# Patient Record
Sex: Female | Born: 1965 | Hispanic: Yes | Marital: Married
Health system: Northeastern US, Academic
[De-identification: ages and names within clinical notes are randomized; demographics above are authoritative.]

---

## 2018-12-31 IMAGING — MR MRI RIGHT FOOT WITHOUT CONTRAST
5 of 7 series · 23 of 40 positions shown · IV contrast (gadolinium)
Comparison: None
COMPARISON: None.

MRI RIGHT FOOT WITHOUT CONTRAST, 12/31/2018 [DATE]: 
CLINICAL INDICATION: Plantar fasciitis. Right heel pain for 5 months.
TECHNIQUE: Multiplanar, multiecho position MR images of the right hind- and 
midfoot were performed without intravenous gadolinium
TECHNIQUE: Multiplanar, multisequence imaging of the hind-/midfoot was obtained 
without contrast.

[Series 101: survey_fullfov_transversal · axial · 10.0mm · 1.84mm/px · z∈[-82,+284]mm · 6 of 36 slices shown]
[im 1/36]
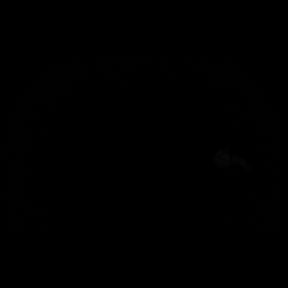
[im 8/36]
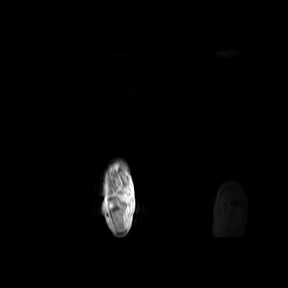
[im 15/36]
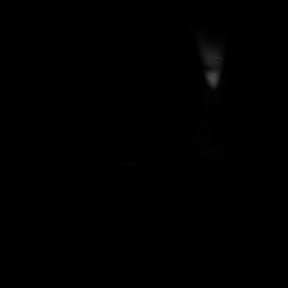
[im 22/36]
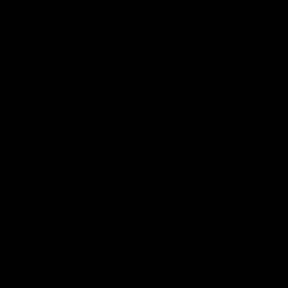
[im 29/36]
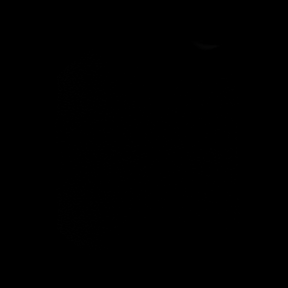
[im 36/36]
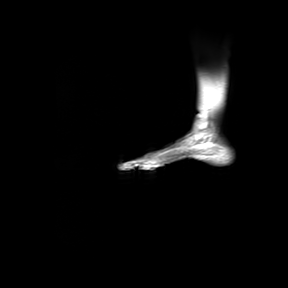

[Series 201: survey_right · axial · 10.0mm · 0.94mm/px · z∈[-40,+191]mm · 2 of 15 slices shown]
[im 1/15]
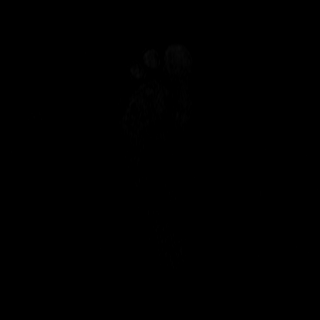
[im 15/15]
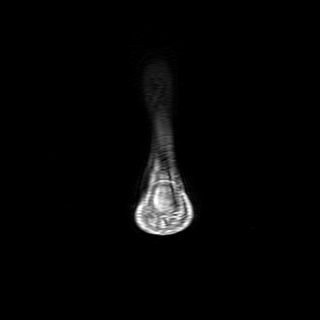

[Series 301: t1w_(id) · sagittal · 2.0mm · 0.20mm/px · 2 of 32 slices shown]
[im 1/32]
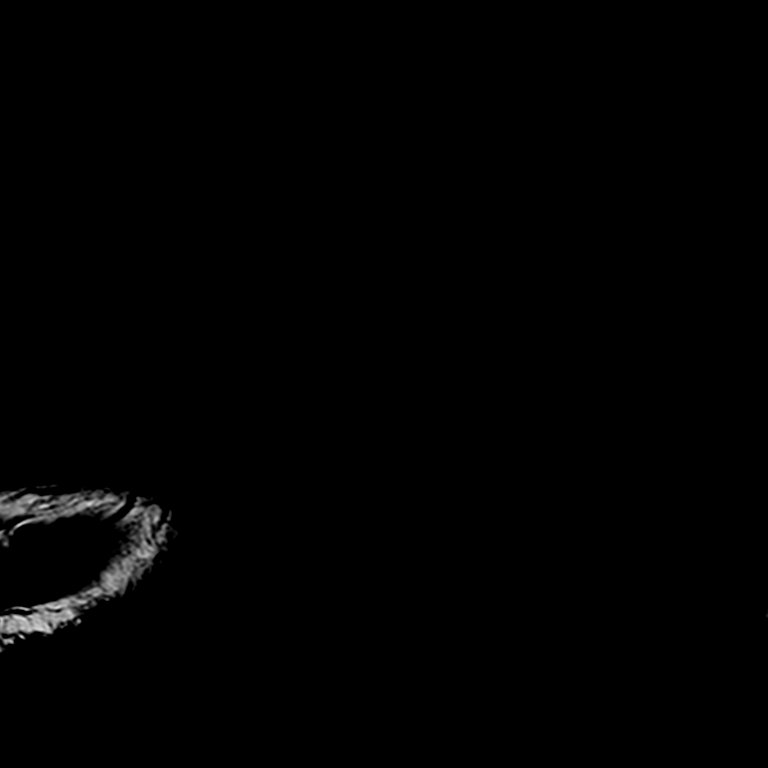
[im 7/32]
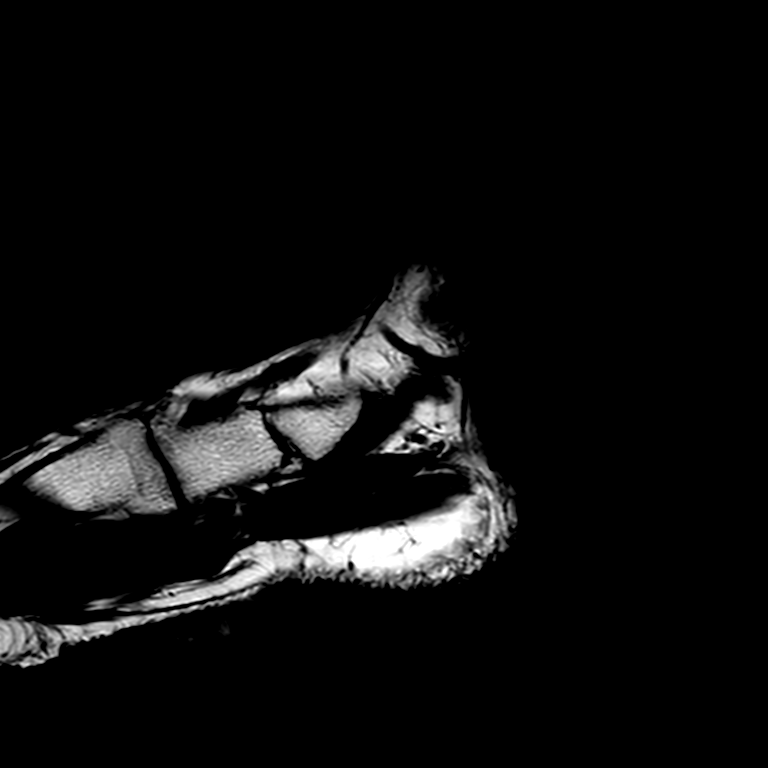

[Series 401: PD fat-sat · sagittal · 2.0mm · 0.30mm/px · 6 of 32 slices shown]
[im 1/32]
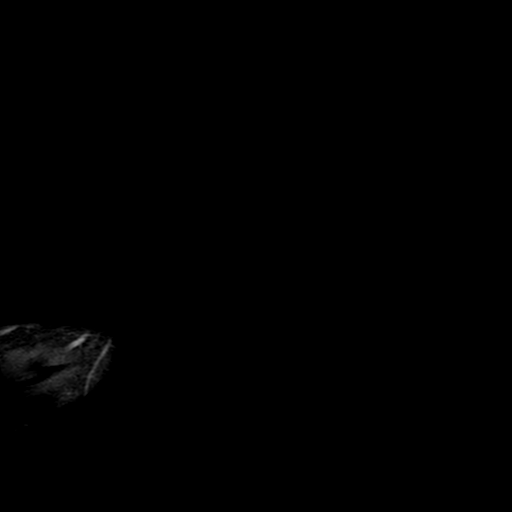
[im 7/32]
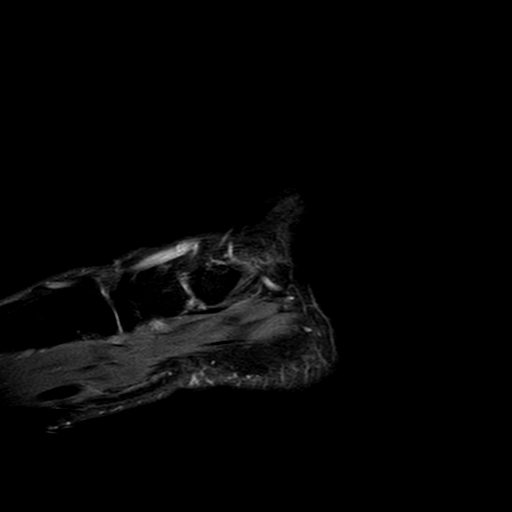
[im 13/32]
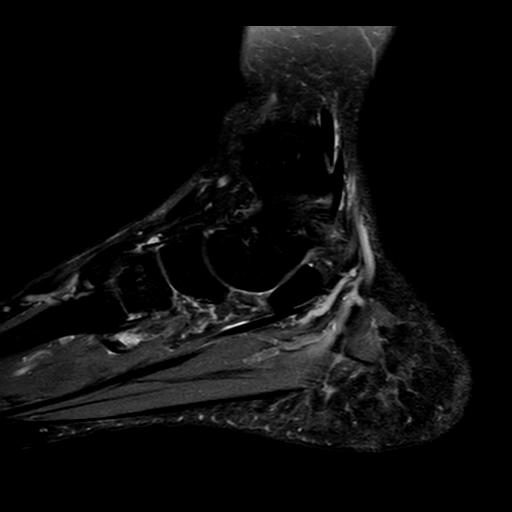
[im 19/32]
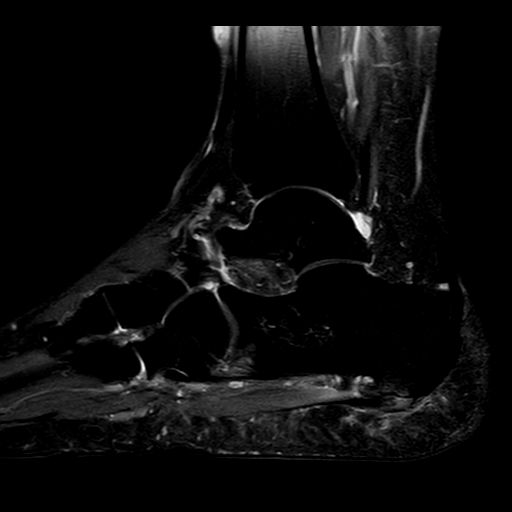
[im 25/32]
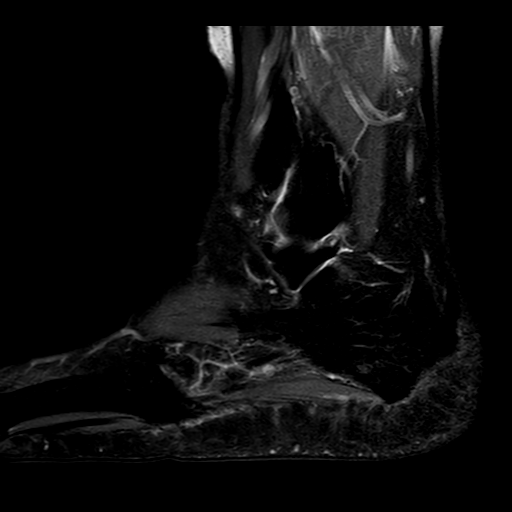
[im 32/32]
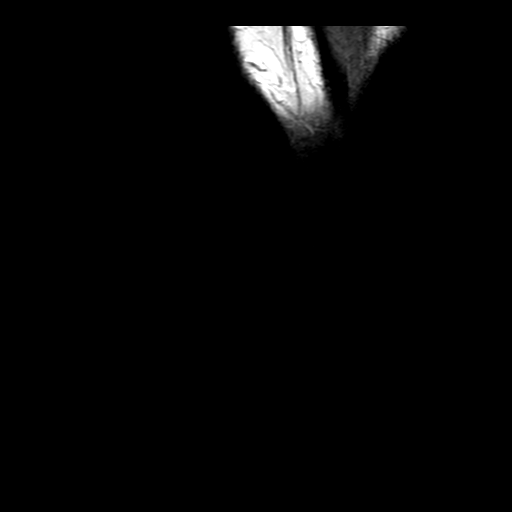

[Series 501: T1 · coronal · 3.0mm · 0.28mm/px · 7 of 36 slices shown]
[im 1/36]
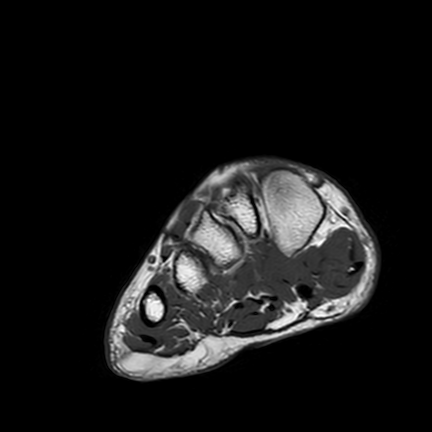
[im 6/36]
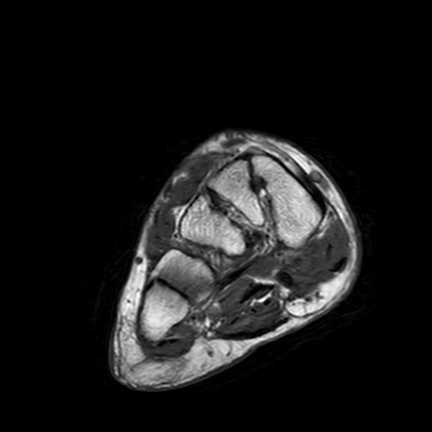
[im 12/36]
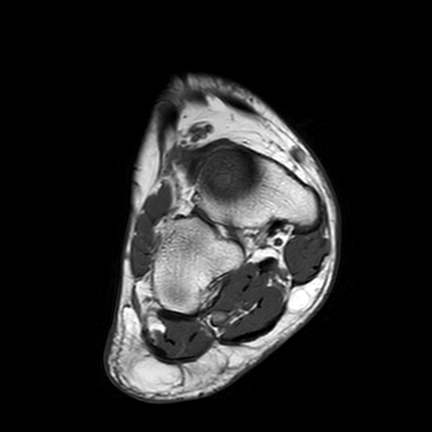
[im 18/36]
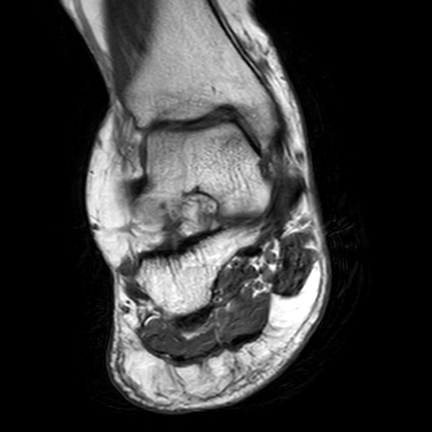
[im 24/36]
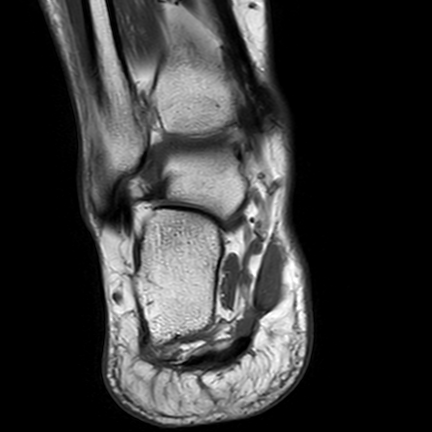
[im 30/36]
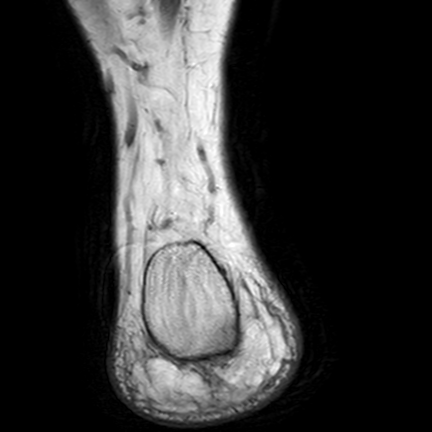
[im 36/36]
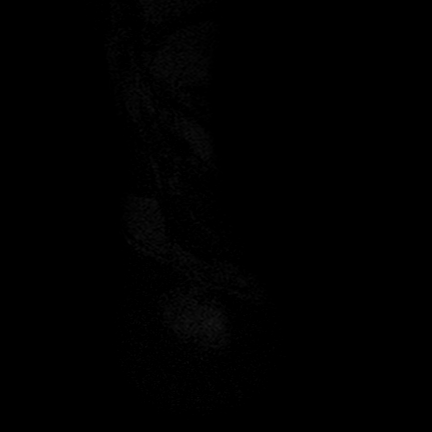

[23 of 40 positions shown; findings below may reference images not displayed]

FINDINGS: PLANTAR FASCIA: The proximal central band of the plantar fascia is thickened to 
6 mm and intermediate signal intensity, consistent with plantar fasciitis. No 
focal plantar fascial tear. Remainder of the imaged plantar fascia is normal. 
TENDONS: The flexor, extensor and peroneal tendons are intact. No tendon tear, 
tenosynovitis or tendinopathy. No tendon subluxation. The Achilles tendon is 
preserved. 
LIGAMENTS: 
LATERAL LIGAMENTS:The anterior talofibular ligament is intact. The 
calcaneofibular ligament and posterior talofibular ligament are preserved. 
SYNDESMOTIC LIGAMENTS:The anterior and posterior tibiofibular and interosseous 
ligaments are preserved. 
DELTOID LIGAMENTOUS COMPLEX:The deep and superficial components of the deltoid 
ligamentous complex are intact. 
SINUS TARSI LIGAMENTS: The cervical and interosseous ligaments are preserved. 
The inferior extensor retinaculum appears intact. 
BONES AND SOFT TISSUES: Edematous plantar calcaneus and plantar calcaneal 
enthesophyte (6 mm in AP dimensions). No fracture. The talar dome is preserved. 
No focal osteochondral lesion. Trace amount of fluid in the retrocalcaneal 
bursa. Musculature is symmetric without mass, signal abnormality or atrophy. 
Sinus tarsi fat is preserved. Tarsal tunnel is preserved. Deep plantar heel pad 
soft tissue swelling.
IMPRESSION: Plantar fasciitis of the proximal central band, edematous plantar calcaneus and 
plantar calcaneal enthesophyte and deep plantar heel pad soft tissue swelling.

## 2020-04-13 IMAGING — MR MRI RIGHT FOOT WITHOUT CONTRAST
4 of 5 series · 26 of 40 positions shown · IV contrast (gadolinium)
Comparison: 12/31/2018

OMAE, USHIKI 
MRI RIGHT FOOT WITHOUT CONTRAST, 04/13/2020 [DATE]: 
CLINICAL INDICATION: Heel pain
TECHNIQUE: Multiplanar, multiecho position MR images of the foot were performed 
without intravenous gadolinium enhancement.

[Series 101: survey_fullfov_transversal · axial · 10.0mm · 1.84mm/px · z∈[-51,+51]mm · 2 of 7 slices shown]
[im 1/7]
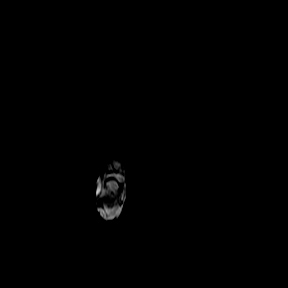
[im 7/7]
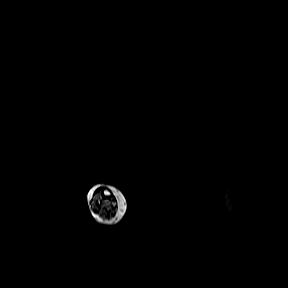

[Series 301: (person_name)_(person_name)_(person_name) · axial · 3.0mm · 0.34mm/px · z∈[-122,+17]mm · 9 of 42 slices shown]
[im 1/42]
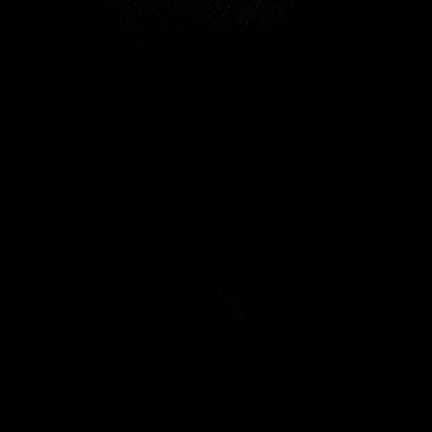
[im 8/42]
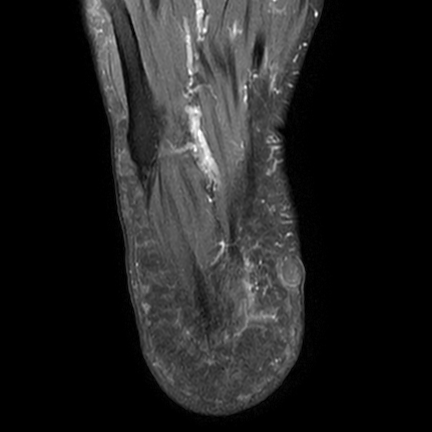
[im 12/42]
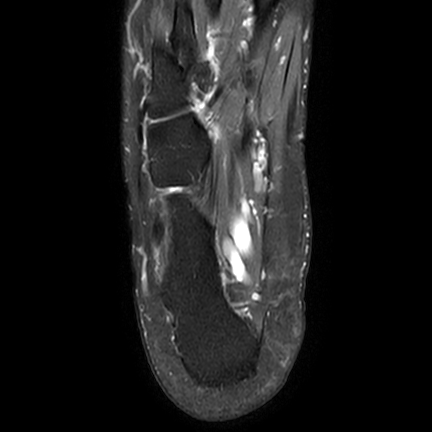
[im 19/42]
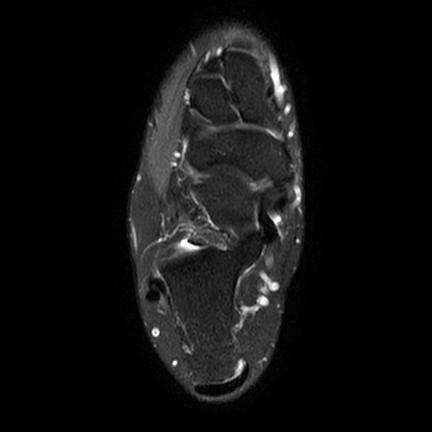
[im 23/42]
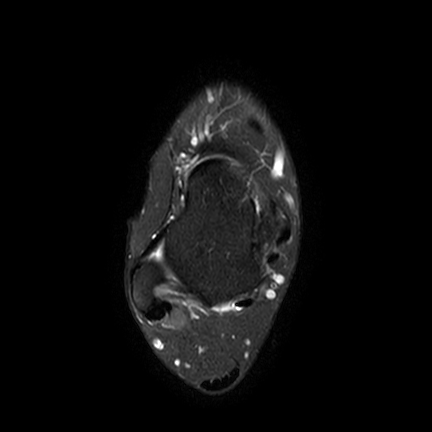
[im 30/42]
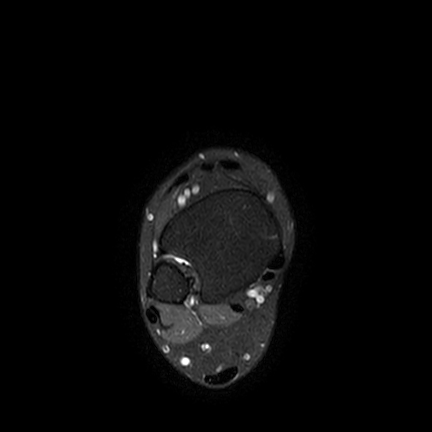
[im 34/42]
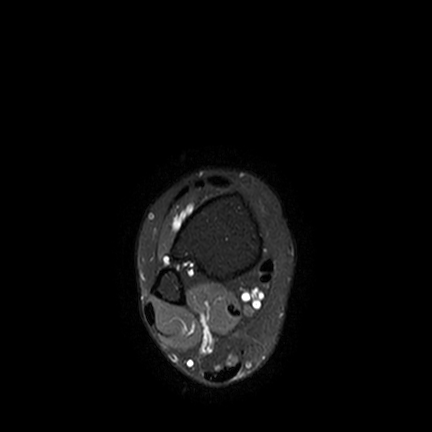
[im 38/42]
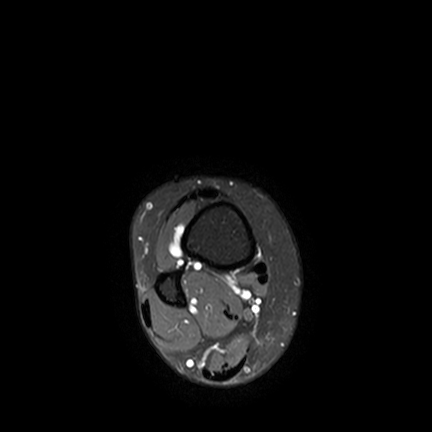
[im 42/42]
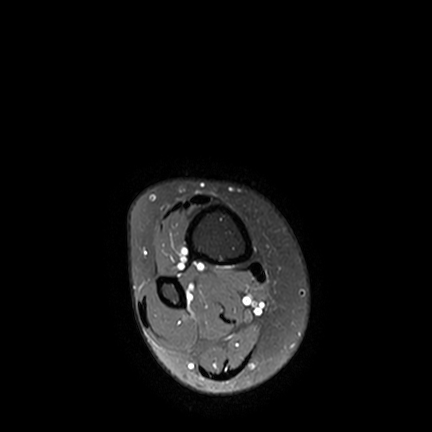

[Series 401: t1_sag · sagittal · 2.5mm · 0.31mm/px · 8 of 27 slices shown]
[im 1/27]
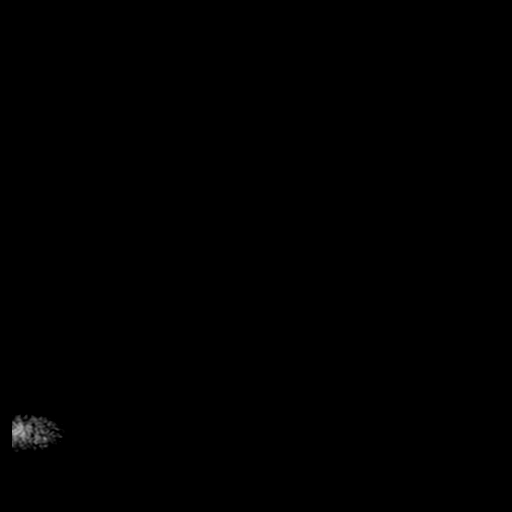
[im 4/27]
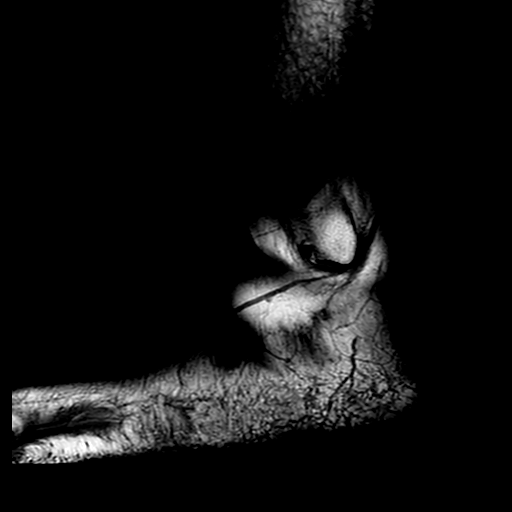
[im 8/27]
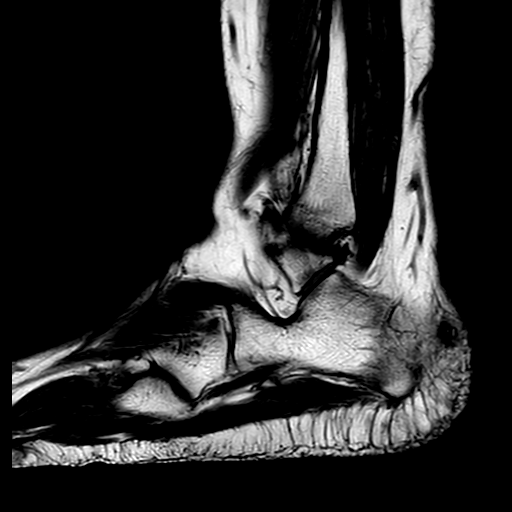
[im 12/27]
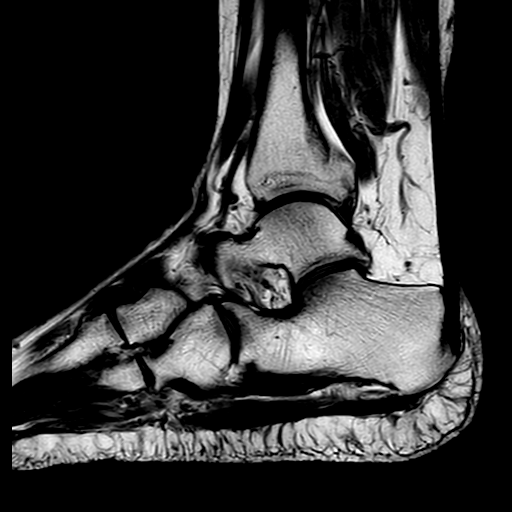
[im 15/27]
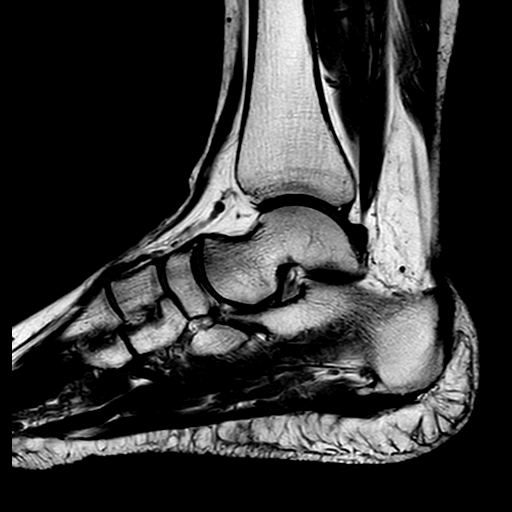
[im 19/27]
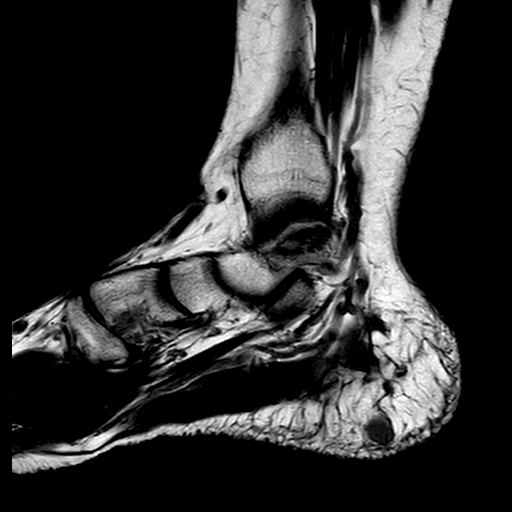
[im 23/27]
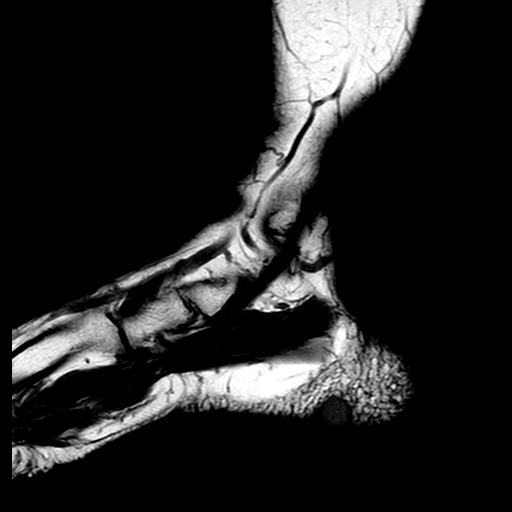
[im 27/27]
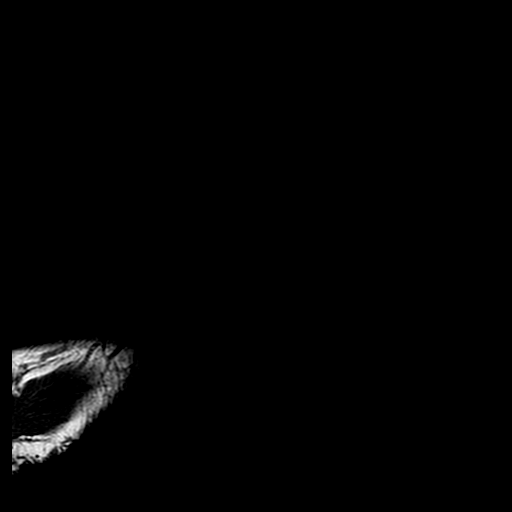

[Series 501: pd_fs_sag · sagittal · 2.5mm · 0.45mm/px · 7 of 27 slices shown]
[im 1/27]
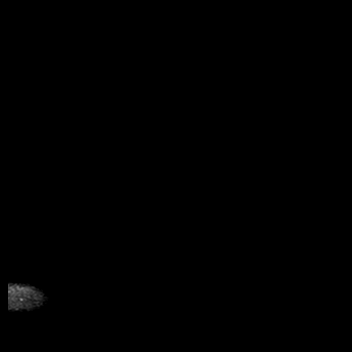
[im 4/27]
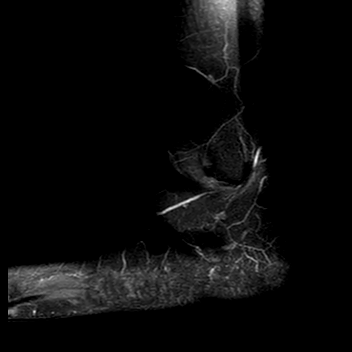
[im 8/27]
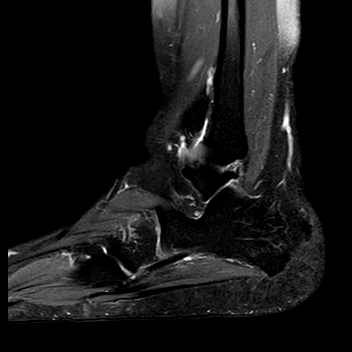
[im 12/27]
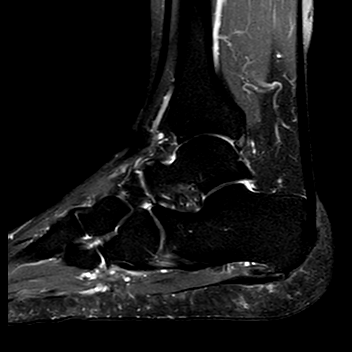
[im 15/27]
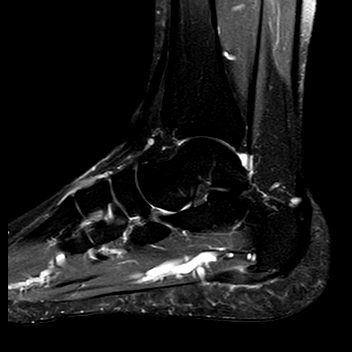
[im 19/27]
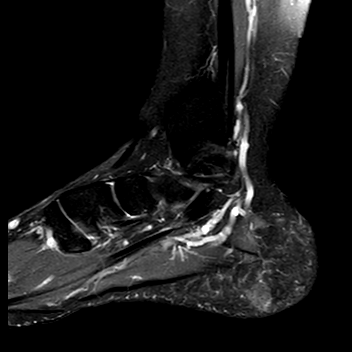
[im 23/27]
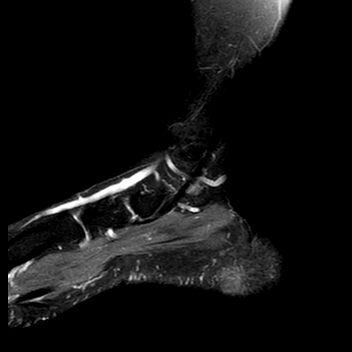

[26 of 40 positions shown; findings below may reference images not displayed]

FINDINGS: PLANTAR FASCIA: The proximal central band of the plantar fascia has 
progressively thickened to 7 mm (previously 6 mm) and is intermediate signal 
intensity, consistent with plantar fasciitis. Perifascial edema. No focal 
plantar fascial tear. Remainder of the imaged plantar fascia is normal. 
TENDONS: The flexor, extensor and peroneal tendons are intact. No tendon tear, 
tenosynovitis or tendinopathy. No tendon subluxation. The Achilles tendon is 
preserved. 
LIGAMENTS: 
LATERAL LIGAMENTS:The anterior talofibular ligament is intact. The 
calcaneofibular ligament and posterior talofibular ligament are preserved. 
SYNDESMOTIC LIGAMENTS:The anterior and posterior tibiofibular and interosseous 
ligaments are preserved. 
DELTOID LIGAMENTOUS COMPLEX:The deep and superficial components of the deltoid 
ligamentous complex are intact. 
SINUS TARSI LIGAMENTS: The cervical and interosseous ligaments are preserved. 
The inferior extensor retinaculum appears intact. 
BONES AND SOFT TISSUES: Resolution of the plantar calcaneus and plantar 
calcaneal enthesophyte edema. Enlargement of the plantar calcaneal enthesophyte, 
which now measures 1.3 cm in AP dimensions (previously 0.6 cm). No fracture. The 
talar dome is preserved. No focal osteochondral lesion. Two new 1 cm rounded 
intermediate signal intensity soft tissue foci in the medial heel plantar fat 
pad, likely granulomas. Trace amount of fluid in the retrocalcaneal bursa. 
Musculature is symmetric without mass, signal abnormality or atrophy. Sinus 
tarsi fat is preserved. Tarsal tunnel is preserved.  Stable deep plantar heel 
pad soft tissue swelling.
IMPRESSION: 1.  Progression of the plantar fasciitis and enlargement of the plantar 
calcaneal enthesophyte. 
2.  Two new 1 cm rounded intermediate signal intensity soft tissue foci in the 
medial heel plantar fat pad, likely granulomas.

## 2020-10-10 MED ORDER — DULOXETINE 20 MG CAPSULE,DELAYED RELEASE
20 | Freq: Every day | ORAL | 2.00 refills | 45.00000 days | Status: AC
Start: 2020-10-10 — End: 2021-01-17

## 2020-10-10 MED ORDER — FLUTICASONE PROPIONATE 50 MCG/ACTUATION NASAL SPRAY,SUSPENSION
50 | NASAL | 3.00 refills | 30.00000 days | Status: AC
Start: 2020-10-10 — End: ?

## 2020-10-10 MED ORDER — FLUTICASONE PROPIONATE 50 MCG/ACTUATION BLISTER POWDER FOR INHALATION
50 | RESPIRATORY_TRACT | 3.00 refills | 30.00000 days | Status: AC
Start: 2020-10-10 — End: 2021-06-14

## 2020-10-10 MED ORDER — IBUPROFEN 800 MG TABLET
800 | ORAL | 1.00 refills | 15.00000 days | Status: DC
Start: 2020-10-10 — End: 2021-01-05

## 2020-10-10 MED ORDER — ALBUTEROL SULFATE HFA 90 MCG/ACTUATION AEROSOL INHALER
90 | RESPIRATORY_TRACT | 2.00 refills | 25.00000 days | Status: AC
Start: 2020-10-10 — End: ?

## 2020-10-10 MED ORDER — QUETIAPINE IMMEDIATE RELEASE 25 MG TABLET
25 | ORAL | 1.00 refills | 30.00000 days | Status: AC
Start: 2020-10-10 — End: 2021-01-17

## 2020-10-10 MED ORDER — HYDROCORTISONE 2.5 % TOPICAL CREAM
2.5 | TOPICAL | 3.00 refills | 15.00000 days | Status: AC
Start: 2020-10-10 — End: ?

## 2020-11-04 MED ORDER — OLOPATADINE 0.2 % EYE DROPS
0.2 | OPHTHALMIC | 2.00 refills | 25.00000 days | Status: AC | PRN
Start: 2020-11-04 — End: 2021-04-13

## 2020-11-04 MED ORDER — DUPIXENT 300 MG/2 ML SUBCUTANEOUS PEN INJECTOR
300 | SUBCUTANEOUS | 1.00 refills | 28.00000 days | Status: AC
Start: 2020-11-04 — End: 2021-06-27

## 2020-11-04 MED ORDER — PREDNISONE 20 MG TABLET
20 | ORAL_TABLET | Freq: Every day | ORAL | 1 refills | 12.00000 days | Status: AC
Start: 2020-11-04 — End: ?

## 2020-11-04 MED ORDER — DULOXETINE 30 MG CAPSULE,DELAYED RELEASE
30 | ORAL | 2.00 refills | 45.00000 days | Status: AC
Start: 2020-11-04 — End: 2021-01-17

## 2020-11-04 MED ORDER — TACROLIMUS 0.1 % TOPICAL OINTMENT
0.1 | TOPICAL | 0.00 refills | 30.00000 days | Status: AC
Start: 2020-11-04 — End: ?

## 2020-11-11 ENCOUNTER — Inpatient Hospital Stay: Admit: 2020-11-11 | Discharge: 2020-11-11 | Payer: BLUE CROSS/BLUE SHIELD

## 2020-11-11 ENCOUNTER — Encounter: Admit: 2020-11-11 | Payer: PRIVATE HEALTH INSURANCE | Attending: Diagnostic Radiology | Primary: Internal Medicine

## 2020-11-11 ENCOUNTER — Emergency Department: Admit: 2020-11-11 | Payer: BLUE CROSS/BLUE SHIELD | Primary: Internal Medicine

## 2020-11-11 DIAGNOSIS — M79642 Pain in left hand: Secondary | ICD-10-CM

## 2020-11-11 DIAGNOSIS — S60052A Contusion of left little finger without damage to nail, initial encounter: Secondary | ICD-10-CM

## 2020-11-11 DIAGNOSIS — S60042A Contusion of left ring finger without damage to nail, initial encounter: Secondary | ICD-10-CM
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
## 2020-11-11 NOTE — Discharge Instructions
You were seen for left hand pain today diagnosed with hand contusion.  Continue to take NSAIDs as needed for pain.  Follow-up with primary care within 1 week as directed.  Apply ice for 15 minute intervals as needed.  Return for new or worsening symptoms

## 2021-01-05 ENCOUNTER — Emergency Department: Admit: 2021-01-05 | Payer: BLUE CROSS/BLUE SHIELD | Primary: Internal Medicine

## 2021-01-05 ENCOUNTER — Inpatient Hospital Stay: Admit: 2021-01-05 | Discharge: 2021-01-05 | Payer: BLUE CROSS/BLUE SHIELD

## 2021-01-05 ENCOUNTER — Encounter: Admit: 2021-01-05 | Payer: PRIVATE HEALTH INSURANCE | Primary: Internal Medicine

## 2021-01-05 DIAGNOSIS — Z79899 Other long term (current) drug therapy: Secondary | ICD-10-CM

## 2021-01-05 DIAGNOSIS — S63617A Unspecified sprain of left little finger, initial encounter: Secondary | ICD-10-CM

## 2021-01-05 DIAGNOSIS — Z885 Allergy status to narcotic agent status: Secondary | ICD-10-CM

## 2021-01-05 DIAGNOSIS — S80212A Abrasion, left knee, initial encounter: Secondary | ICD-10-CM

## 2021-01-05 DIAGNOSIS — M25562 Pain in left knee: Secondary | ICD-10-CM

## 2021-01-05 DIAGNOSIS — M25561 Pain in right knee: Secondary | ICD-10-CM

## 2021-01-05 DIAGNOSIS — S8001XA Contusion of right knee, initial encounter: Secondary | ICD-10-CM

## 2021-01-05 DIAGNOSIS — M79642 Pain in left hand: Secondary | ICD-10-CM

## 2021-01-05 MED ORDER — NAPROXEN 500 MG TABLET
500 mg | ORAL_TABLET | Freq: Two times a day (BID) | ORAL | 1 refills | Status: AC
Start: 2021-01-05 — End: 2021-01-05

## 2021-01-05 MED ORDER — DICLOFENAC 1 % TOPICAL GEL
1 % | Freq: Four times a day (QID) | TOPICAL | 1 refills | Status: AC
Start: 2021-01-05 — End: 2021-06-14

## 2021-01-05 MED ORDER — IBUPROFEN 800 MG TABLET
800 mg | ORAL_TABLET | Freq: Three times a day (TID) | ORAL | 1 refills | Status: AC
Start: 2021-01-05 — End: ?

## 2021-01-05 NOTE — Discharge Instructions
Your x-rays were normal.  No fractures.Wear Ace wrap for support.Take naproxen for pain.Use Voltaren cream to area of pain.

## 2021-01-05 NOTE — ED Notes
Pt d/c instructions given by provider verbally with written materials, Patient understands discharge instructions. pt in wheelchair. Discharge to home. Discharged by PA Caro Hight
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
## 2021-01-05 NOTE — ED Notes
Provider at bedside

## 2021-01-17 ENCOUNTER — Inpatient Hospital Stay: Admit: 2021-01-17 | Discharge: 2021-01-17 | Payer: BLUE CROSS/BLUE SHIELD | Primary: Internal Medicine

## 2021-01-17 DIAGNOSIS — M79642 Pain in left hand: Secondary | ICD-10-CM

## 2021-01-17 DIAGNOSIS — M2550 Pain in unspecified joint: Secondary | ICD-10-CM

## 2021-01-17 DIAGNOSIS — M79641 Pain in right hand: Secondary | ICD-10-CM

## 2021-01-17 DIAGNOSIS — I10 Essential (primary) hypertension: Secondary | ICD-10-CM

## 2021-01-17 DIAGNOSIS — M25561 Pain in right knee: Secondary | ICD-10-CM

## 2021-01-17 DIAGNOSIS — Z1321 Encounter for screening for nutritional disorder: Secondary | ICD-10-CM

## 2021-01-17 DIAGNOSIS — F32A Depression, unspecified depression type: Secondary | ICD-10-CM

## 2021-01-17 DIAGNOSIS — M545 Chronic low back pain, unspecified back pain laterality, unspecified whether sciatica present: Secondary | ICD-10-CM

## 2021-01-17 DIAGNOSIS — G8929 Other chronic pain: Secondary | ICD-10-CM

## 2021-01-17 DIAGNOSIS — Z131 Encounter for screening for diabetes mellitus: Secondary | ICD-10-CM

## 2021-01-17 DIAGNOSIS — M159 Polyosteoarthritis, unspecified: Secondary | ICD-10-CM

## 2021-01-17 DIAGNOSIS — M25562 Pain in left knee: Secondary | ICD-10-CM

## 2021-01-17 DIAGNOSIS — M542 Cervicalgia: Secondary | ICD-10-CM

## 2021-01-17 LAB — CBC WITH AUTO DIFFERENTIAL
BKR WAM ABSOLUTE IMMATURE GRANULOCYTES.: 0.01 x 1000/??L (ref 0.00–0.30)
BKR WAM ABSOLUTE LYMPHOCYTE COUNT.: 2.55 x 1000/??L (ref 0.60–3.70)
BKR WAM ABSOLUTE NEUTROPHIL COUNT.: 3.75 x 1000/??L (ref 2.00–7.60)
BKR WAM BASOPHIL ABSOLUTE COUNT.: 0.03 x 1000/??L (ref 0.00–1.00)
BKR WAM BASOPHILS: 0.4 % (ref 0.0–1.4)
BKR WAM EOSINOPHIL ABSOLUTE COUNT.: 0.24 x 1000/??L (ref 0.00–1.00)
BKR WAM EOSINOPHILS: 3.4 % (ref 0–2)
BKR WAM HEMATOCRIT (2 DEC): 39 % (ref 35.00–45.00)
BKR WAM HEMOGLOBIN: 12.7 g/dL (ref 11.7–15.5)
BKR WAM IMMATURE GRANULOCYTES: 0.1 % (ref 45–117)
BKR WAM LYMPHOCYTES: 36.2 % (ref 17.0–50.0)
BKR WAM MCH PG: 28.7 pg — AB (ref 27.0–33.0)
BKR WAM MCHC: 32.6 g/dL (ref 31.0–36.0)
BKR WAM MCV: 88.2 fL — ABNORMAL HIGH (ref 80.0–100.0)
BKR WAM MONOCYTE ABSOLUTE COUNT.: 0.46 x 1000/??L (ref 0.00–1.00)
BKR WAM MONOCYTES: 6.5 % (ref 0–5)
BKR WAM MPV: 10.5 fL (ref 8.0–12.0)
BKR WAM NEUTROPHILS: 53.4 % — ABNORMAL HIGH (ref 0–3)
BKR WAM PLATELETS: 289 x1000/ÂµL (ref 150–420)
BKR WAM RDW-CV: 14.2 % (ref 11.0–15.0)
BKR WAM RED BLOOD CELL COUNT.: 4.42 M/??L (ref 1.003–1.035)
BKR WAM WHITE BLOOD CELL COUNT: 7 x1000/??L (ref 4.0–11.0)

## 2021-01-17 LAB — COMPREHENSIVE METABOLIC PANEL
BKR ALANINE AMINOTRANSFERASE (ALT): 50 U/L (ref 13–56)
BKR ALBUMIN: 3.9 g/dL (ref 3.4–5.0)
BKR ALKALINE PHOSPHATASE: 85 U/L (ref 45–117)
BKR ANION GAP (LM): 5 mmol/L — AB (ref 5–15)
BKR ASPARTATE AMINOTRANSFERASE (AST): 29 U/L (ref 15–37)
BKR BILIRUBIN TOTAL: 0.4 mg/dL (ref 0.0–<1.0)
BKR BLOOD UREA NITROGEN: 14 mg/dL (ref 7–18)
BKR CALCIUM: 9.2 mg/dL (ref 8.5–10.1)
BKR CHLORIDE: 108 mmol/L — ABNORMAL HIGH (ref 98–107)
BKR CO2: 28 mmol/L (ref 21–32)
BKR CREATININE: 0.61 mg/dL (ref 0.55–1.02)
BKR EGFR, CREATININE (CKD-EPI 2021): >60 mL/min/{1.73_m2} — AB (ref >=60–3.70)
BKR GLOBULIN: 3.4 g/dL (ref 2.5–5.0)
BKR GLUCOSE: 89 mg/dL (ref 65–110)
BKR POTASSIUM: 3.9 mmol/L (ref 3.5–5.1)
BKR PROTEIN TOTAL: 7.3 g/dL (ref 6.4–8.2)
BKR SODIUM: 141 mmol/L (ref 136–145)
BKR UROBILINOGEN, UA - ALPHA: 0.61 mg/dL (ref 0.55–1.02)
BKR WAM NUCLEATED RED BLOOD CELLS: 50 U/L (ref 13–56)

## 2021-01-17 LAB — URINALYSIS WITH CULTURE REFLEX      (BH LMW YH)
BKR BILIRUBIN, UA MG/DL: NEGATIVE mg/dL (ref 7–18)
BKR GLUCOSE, UA MG/DL: NEGATIVE mg/dL
BKR HYALINE CASTS, UA INSTRUMENT: 1 /LPF (ref 0–2)
BKR KETONES, UA MG/DL: NEGATIVE mg/dL (ref 11.0–15.0)
BKR NITRITE, UA: NEGATIVE % (ref 35.00–45.00)
BKR PH, UA: 5 (ref 5.0–7.0)
BKR PROTEIN, UA.: NEGATIVE mg/dL (ref 136–145)
BKR RBC/HPF INSTRUMENT: 19 /HPF — ABNORMAL HIGH (ref 0–3)
BKR SPECIFIC GRAVITY, UA: 1.025 (ref 1.003–1.035)
BKR URINE SQUAMOUS EPITHELIAL CELLS, UA (NUMERIC): 5 /HPF (ref 0–5)
BKR WBC/HPF INSTRUMENT: 6 /HPF — ABNORMAL HIGH (ref 0–5)

## 2021-01-17 LAB — HEMOGLOBIN A1C: BKR HEMOGLOBIN A1C: 6.1 % (ref 4.2–6.3)

## 2021-01-17 LAB — UA REFLEX CULTURE

## 2021-01-17 LAB — C-REACTIVE PROTEIN     (CRP): BKR C-REACTIVE PROTEIN (LM): 0.88 mg/dL — ABNORMAL HIGH (ref <0.30)

## 2021-01-17 LAB — LIPID PANEL
BKR CHOLESTEROL: 143 mg/dL
BKR HDL CHOLESTEROL: 67 mg/dL — ABNORMAL HIGH
BKR LDL CHOLESTEROL SAMPSON CALCULATED: 55 mg/dL
BKR TRIGLYCERIDES: 123 mg/dL

## 2021-01-17 LAB — HEPATITIS C AB WITH REFLEX TO HCV PCR: BKR HEPATITIS C ANTIBODY (LM): NEGATIVE

## 2021-01-17 LAB — TSH W/REFLEX TO FT4     (BH GH LMW Q YH): BKR THYROID STIMULATING HORMONE (LM): 2.53 uIU/mL (ref 0.36–3.74)

## 2021-01-17 LAB — RHEUMATOID FACTOR: BKR RHEUMATOID FACTOR: <10 IU/mL (ref <15.0)

## 2021-01-17 LAB — LYME ANTIBODIES W/RFLX TO CONFIRM (MODIFIED TWO-TIER TESTING)    (BH GH LMW YH): BKR LYME ANTIBODY (LM): NEGATIVE

## 2021-01-17 LAB — HIV-1/HIV-2 ANTIBODY/ANTIGEN SCREEN W/REFLEX     (BH GH LMW YH): BKR HIV AG/AB, 4TH GEN: NEGATIVE ng/mL

## 2021-01-17 MED ORDER — DULOXETINE 60 MG CAPSULE,DELAYED RELEASE
60 | ORAL_CAPSULE | Freq: Every day | ORAL | 4 refills | 45.00000 days | Status: AC
Start: 2021-01-17 — End: ?

## 2021-01-17 MED ORDER — QUETIAPINE IMMEDIATE RELEASE 50 MG TABLET
50 | ORAL_TABLET | Freq: Every evening | ORAL | 4 refills | 30.00000 days | Status: AC
Start: 2021-01-17 — End: ?

## 2021-01-18 LAB — CYCLIC CITRUL PEPTIDE ANTIBODY, IGG: BKR CCP AB, IGG: 0.7 EliA U/mL (ref <7.0)

## 2021-01-18 LAB — URINE CULTURE

## 2021-01-21 LAB — QUESTASSURED 25-OH VITAMIN D, (D2,D3), LC/MS/MS (BH GH LMW Q)
VITAMIN D, 25-OH D2: <4 ng/mL
VITAMIN D, 25-OH D3: 24 ng/mL
VITAMIN D, 25-OH TOTAL: 24 ng/mL — ABNORMAL LOW (ref 30–100)

## 2021-01-23 LAB — ANA IFA SCREEN W/REFL TO TITER AND PATTERN, IFA: ANA SCREEN, IFA: NEGATIVE

## 2021-01-25 NOTE — Other
Left Message for Patient to Return Call Patient has MyChart

## 2021-02-06 ENCOUNTER — Encounter: Admit: 2021-02-06 | Payer: PRIVATE HEALTH INSURANCE | Attending: Orthopedic Surgery | Primary: Internal Medicine

## 2021-02-06 DIAGNOSIS — M5412 Radiculopathy, cervical region: Secondary | ICD-10-CM

## 2021-02-06 DIAGNOSIS — S39012A Strain of muscle, fascia and tendon of lower back, initial encounter: Secondary | ICD-10-CM

## 2021-02-07 NOTE — Other
Spoke with Patient, Notified & Aware of Results

## 2021-03-02 ENCOUNTER — Inpatient Hospital Stay: Admit: 2021-03-02 | Discharge: 2021-03-02 | Payer: BLUE CROSS/BLUE SHIELD | Primary: Internal Medicine

## 2021-03-02 DIAGNOSIS — M4722 Other spondylosis with radiculopathy, cervical region: Secondary | ICD-10-CM

## 2021-03-02 DIAGNOSIS — M48061 Spinal stenosis, lumbar region without neurogenic claudication: Secondary | ICD-10-CM

## 2021-03-02 DIAGNOSIS — S39012A Strain of muscle, fascia and tendon of lower back, initial encounter: Secondary | ICD-10-CM

## 2021-03-02 DIAGNOSIS — N859 Noninflammatory disorder of uterus, unspecified: Secondary | ICD-10-CM

## 2021-03-02 DIAGNOSIS — M5412 Radiculopathy, cervical region: Secondary | ICD-10-CM

## 2021-03-02 DIAGNOSIS — M47816 Spondylosis without myelopathy or radiculopathy, lumbar region: Secondary | ICD-10-CM

## 2021-03-02 DIAGNOSIS — Q7649 Other congenital malformations of spine, not associated with scoliosis: Secondary | ICD-10-CM

## 2021-03-31 ENCOUNTER — Encounter: Admit: 2021-03-31 | Payer: PRIVATE HEALTH INSURANCE | Primary: Internal Medicine

## 2021-03-31 DIAGNOSIS — M7741 Metatarsalgia, right foot: Secondary | ICD-10-CM

## 2021-03-31 DIAGNOSIS — M7661 Achilles tendinitis, right leg: Secondary | ICD-10-CM

## 2021-04-13 ENCOUNTER — Ambulatory Visit: Admit: 2021-04-13 | Payer: BLUE CROSS/BLUE SHIELD | Attending: Internal Medicine

## 2021-04-13 ENCOUNTER — Encounter: Admit: 2021-04-13 | Payer: BLUE CROSS/BLUE SHIELD | Attending: Internal Medicine

## 2021-04-13 ENCOUNTER — Encounter: Admit: 2021-04-13 | Payer: PRIVATE HEALTH INSURANCE | Attending: Internal Medicine

## 2021-04-13 MED ORDER — OLOPATADINE 0.2 % EYE DROPS
0.2 | Freq: Every day | OPHTHALMIC | 3 refills | 25.00000 days | Status: AC
Start: 2021-04-13 — End: ?

## 2021-04-18 ENCOUNTER — Inpatient Hospital Stay: Admit: 2021-04-18 | Discharge: 2021-04-18 | Payer: BLUE CROSS/BLUE SHIELD | Primary: Internal Medicine

## 2021-04-18 DIAGNOSIS — M7741 Metatarsalgia, right foot: Secondary | ICD-10-CM

## 2021-04-18 DIAGNOSIS — M7661 Achilles tendinitis, right leg: Secondary | ICD-10-CM

## 2021-04-18 NOTE — Other
[facility]Water[address] [place] [address] Number: [id number] Number: [phone] Therapy Orthopedic EvaluationPatient Name:  Tracy Pardo CluffMedical Record Number:  [id number] Date of Birth:  [birth year]Therapist:  Deberah Pelton, PTReferring Provider:  Nino Parsley, DPMICD-10 Diagnosis(es):Problem List         ICD-10-CM   PT right foot pain 04/18/21  Right foot pain M79.671  * (Principal) Tendonitis, Achilles, right M76.61  Gait abnormality R26.9  Metatarsalgia of right foot M77.41 Your signature indicates that you have reviewed and agree with the established Plan of Care as documented below. This order renewal is required for therapy to continue on an outpatient basis. Please co-sign this document electronically or return via the fax number listed on the document.  Your prompt response is required and appreciated. Additional Physician Comments:   ___________________________________       __________________________________Physician Signature                                             Date___________________________________Physician Printed NameGeneral InformationTherapy Episode of Care  Date of Visit:  04/18/2021   Treatment Number:  1   Start of Care Date:  04/18/2021   Onset of Illness/Injury Date:  03/20/2021   Progress Report Due Date:  05/29/2021 Precautions/Limitations   Precautions/Limitations:  No known precautions/limitationsNew Neurological Deficit   Did the patient have a new neurological deficit as evidenced by diminished muscle strength   of less than 3 at any time during the 90 days after spine surgery:  [employer] Utilized?  NoCognition / Learning Assessment   Primary Learner Relationship:  Patient        Barriers to learning:  No barriers        Preferred language: English        Preferred learning style:  Listening and DemonstrationI reviewed the Patient Care Agreement and Attendance Form with the Patient/Family.  The Patient/Family verbalized understanding.Medication Review:Current Outpatient Medications Medication Sig ? albuterol sulfate  ? diclofenac Apply topically 4 (four) times daily. ? DULoxetine Take 1 capsule (60 mg total) by mouth daily. ? Dupixent Pen Inject 2 mLs (300 mg total) under the skin Once every 2 weeks. ? fluticasone propionate fluticasone propionate 50 mcg/actuation nasal spray,suspension 2 SPRAYS EACH NARE IN THE MORNING ? fluticasone propionate  ? hydrocortisone hydrocortisone 2.5 % topical cream ? ibuprofen Take 1 tablet (800 mg total) by mouth 3 (three) times daily. ? olopatadine Place 1 drop into both eyes daily. ? QUEtiapine Take 1 tablet (50 mg total) by mouth nightly. ? tacrolimus tacrolimus 0.1 % topical ointment APPLY 1 APPLICATION ON THE SKIN TWICE A DAY SubjectiveI do stretches twice a day already. I just want to have the surgery to help the pain. Pertinent History of Current Problem:  56 yo female with right foot pain that has started to increase again in the past few months and just wants it to feel better and feels it is the heel spur causing the pain. Patient has had two foot surgeries recently release of the plantar fascia last year and then followed by removal of cysts from the same area. Patient does wear shoes at all the time in the home uses orthotics in the right shoe at all times. Patient wears the [employer] sneakers and does stretching of the foot every day. Patient uses ibuprofen for the pain and has found only thing  to help her. Patient reports pain worsens by the end of the day. Cannot rest heel on furniture and needs to place with a pillow. Currently visiting [facility] but [facility] [place] [facility].  Patient wishes for heel spur surgery to help with the pain.Other Providers: [email or web address] Medical HistoryNo past medical history on file.Past Surgical HistoryPast Surgical History: Procedure Laterality Date ? THYROIDECTOMY Right  ? TUBAL LIGATION Bilateral  AllergiesCodeinePain Rating:  4-9 at the end of the day / 10   Comments:  Achilles tendon insertion region.Social / Emotional Information: does own cleaning in the home, Prior Level of Function:  Full functioning   Change in Status from prior level of function?  Unable to walk distances, cannot place foot on furniture, greater difficulty falling asleep and rests calf on pillow to fall asleep, difficulty resting foot to drive and keeps foot in the air to drive, unable to walk outside home and husband does grocery shopping. ObjectivePosture: Pes planus bil Increased lumbar lordosisMMT/ROM:Right DF; 12, PF: 46, inversion 26, Eversion: 12Left foot DF: 12, PF: 46, inversion; 26; eversion 12Strength RLE 5/5 with pain with WB plantarflexion LLE 5/5Joint Mobility:WNLPalpation:Tightness right plantarfascia no TTPTenderness to palpation at right calcaneal tuberosity and distal insertion of achilles tendon rightSensation:Intact to light touch bil lEBalance:Difficulty with SLS due to pain RLE but able to maintain for 10 seconds with intrinsic assistSpecial TestsNAFunctional MobilityGait AssessmentDecreased stance time RLE with decreased heel strike and toe off RLE slow cadenceOrthopedic Tools/Scales/Outcome MeasuresLower Extremity Functional Scale      Any of your usual work/household/school activities:  1      Your usual hobbies/recreation/sporting activities:  0      Getting into or out of the bath:  2      Walking between rooms:  3      Putting on your socks:   4      Squatting:  3      Lifting an object from the floor:  4      Performing light activities around your home:  3      Performing heavy activities around your home:  0      Getting into or out of your car:  2      Walking 2 blocks:  0      Walking a mile:  0      Go up or down 10 stairs:  2      Standing for 1 hour:  0      Sitting for 1 hour:  4      Running on even ground:  0      Running on uneven ground:  0      Making sharp turns while running fast:  0      Hopping:  0      Rolling over in bed:  0   Total Score (out of a possible 80):  28   76-78/80 is normal for population   Minimal detectable change = 9 points   Minimal clinically important difference = 9 points   Potential error +/- 5.3 points   Comments:  Assessment based on foot vs LBPTreatment Provided This VisitCPT Code Interventions Timed Minutes Untimed Minutes Total Minutes Physical Therapy Evaluation - Moderate Complexity (97162) Initial evaluation completedUS pulsed to achilles insertion region RLE pulsed 3.3 MHz at 0.7 w/cm2gastroc stretch towel in long sitting x3 30 secSTM to achilles tendon  45  N/A     N/A     N/A  Total Treatment Time: 45 Problem ListDecreased walking tolerance to household distance-- unable to grocery shopDifficulty getting comfortable to sleep at night and takes 1 hour to fall asleepPlaces RLE calf on pillow to sleep and for putting feet up at night, unable to rest heel on furnitureDecreased standing tolerance to 10 minutes for linesLEFS based on foot vs LBP 28/80 Assessment55 yo female with inflammation about the insertion of the achilles tendon at the calcaneal tuberosity and referred to PT by Dr. Kandice Moos due to the continued pain. Patient is compliant with use of orthotics at all times and shoes in home at all times but despite that having constant pain in the right foot. Patient with good flexibility of the right ankle with mild restrictions in the heel cord as well as tenderness and good strength of the right foot. PT recommended for STM with IASTM to improve flexibility of the achilles tendon as well as [place] to help reduce inflammation in the region to decrease pain and improve functioning. PT recommended at 2x/week for an additional 4 weeks. Patient / Family / Caregiver EducationDiscussed role of therapyDiscussed the value of collaboration with other providersDiscussed the presenting problemReviewed the assessmentDiscussed plan of care and rationalePatient/Family/Caregiver demonstrate agreement with the planWritten materials / instruction providedRehab PotentialGoodPlanPhysical Therapy Evaluation - Moderate Complexity (97162)Therapeutic Exercise (97110)Manual Therapy (97140)Heat - Ice Pack Ultrasound (97035)Frequency:  2x per weekDuration:  4 weeksPlan for Next VisitIASTM, Standing PF stretch 1/2 rollRecommendationsOrthotics and shoes at all times, ice at nightGoals2 weeks.1. Decreased left heel pain by one point2. Independent with HEP to date4 weeks.1. Reduction in heel pain 0-4 at all times so able to go to the grocery store for shopping2. Able to rest feet on furniture at night due to less tenderness in region 3. Able to fall asleep easier at night due to less pain4. Independent with complete HEP5. Able to walk neighborhood 15 min 3x/week due to less foot pain

## 2021-04-20 ENCOUNTER — Inpatient Hospital Stay: Admit: 2021-04-20 | Discharge: 2021-04-20 | Payer: BLUE CROSS/BLUE SHIELD | Primary: Internal Medicine

## 2021-04-20 DIAGNOSIS — R269 Unspecified abnormalities of gait and mobility: Secondary | ICD-10-CM

## 2021-04-20 DIAGNOSIS — M79671 Pain in right foot: Secondary | ICD-10-CM

## 2021-04-20 DIAGNOSIS — M7661 Achilles tendinitis, right leg: Secondary | ICD-10-CM

## 2021-04-20 DIAGNOSIS — M7741 Metatarsalgia, right foot: Secondary | ICD-10-CM

## 2021-04-21 NOTE — Other
[facility] [place] [address] Number: [phone] Number: [phone] Therapy Daily NotePatient Name:  Tracy Iglesia CluffMedical Record Number:  [id number] Date of Birth:  [birth year]Therapist:  Deberah Pelton, PTReferring Provider:  Nino Parsley, DPMICD-10 Diagnosis(es):Problem List         ICD-10-CM   PT right foot pain 04/18/21  Right foot pain M79.671  Tendonitis, Achilles, right M76.61  Gait abnormality R26.9  Metatarsalgia of right foot M77.41 General InformationTherapy Episode of Care  Date of Visit:  04/20/2021   Treatment Number:  2/30   Start of Care Date:  04/18/2021   Onset of Illness/Injury Date:  03/20/2021   Progress Report Due Date:  05/29/2021 Precautions/Limitations   Precautions/Limitations:  No known precautions/limitations and Swallowing precautionsNew Neurological Deficit   Did the patient have a new neurological deficit as evidenced by diminished muscle strength   of less than 3 at any time during the 90 days after spine surgery:  [employer] Utilized?  NoCognition / Learning Assessment   Primary Learner Relationship:  Patient        Barriers to learning:  No barriers        Preferred language:  English        Preferred learning style:  Listening and DemonstrationI reviewed the Patient Care Agreement and Attendance Form with the Patient/Family.  The Patient/Family verbalized understanding.Medication Review:Current Outpatient Medications Medication Sig ? albuterol sulfate  ? diclofenac Apply topically 4 (four) times daily. ? DULoxetine Take 1 capsule (60 mg total) by mouth daily. ? Dupixent Pen Inject 2 mLs (300 mg total) under the skin Once every 2 weeks. ? fluticasone propionate fluticasone propionate 50 mcg/actuation nasal spray,suspension 2 SPRAYS EACH NARE IN THE MORNING ? fluticasone propionate  ? hydrocortisone hydrocortisone 2.5 % topical cream ? ibuprofen Take 1 tablet (800 mg total) by mouth 3 (three) times daily. ? olopatadine Place 1 drop into both eyes daily. ? QUEtiapine Take 1 tablet (50 mg total) by mouth nightly. ? tacrolimus tacrolimus 0.1 % topical ointment APPLY 1 APPLICATION ON THE SKIN TWICE A DAY SubjectiveThe pain unchanged. ObjectiveTreatment Provided This VisitCPT Code Interventions Timed Minutes Untimed Minutes Total Minutes Ultrasound (63875) [place] pulsed to achilles insertion region RLE pulsed 3.3 MHz at 0.7 w/cm2gastroc stretch towel in long sitting x3 30 sec 6   Manual Therapy (97140) STM to achilles tendonSTM to longitudinal archTaping to the calcaneal  15   N/A     N/A       Total Treatment Time: 21 AssessmentThe pain unchanged. Tape to the [place] achilles tendon insertion. PlanFrequency:  2x per weekPlan for Next VisitContinue PT to reduce pain in calcaneal insertion.

## 2021-04-24 ENCOUNTER — Inpatient Hospital Stay: Admit: 2021-04-24 | Discharge: 2021-04-24 | Payer: BLUE CROSS/BLUE SHIELD | Primary: Internal Medicine

## 2021-04-24 DIAGNOSIS — M7661 Achilles tendinitis, right leg: Secondary | ICD-10-CM

## 2021-04-24 DIAGNOSIS — R269 Unspecified abnormalities of gait and mobility: Secondary | ICD-10-CM

## 2021-04-24 DIAGNOSIS — M79671 Pain in right foot: Secondary | ICD-10-CM

## 2021-04-24 DIAGNOSIS — M7741 Metatarsalgia, right foot: Secondary | ICD-10-CM

## 2021-04-24 NOTE — Other
[facility] [place] [address] Number: [phone] Number: [phone] Therapy Daily NotePatient Name:  Tracy Fernandez CluffMedical Record Number:  [id number] Date of Birth:  January 07, 1967Therapist:  Deberah Pelton, PTReferring Provider:  Nino Parsley, DPMICD-10 Diagnosis(es):Problem List         ICD-10-CM   PT right foot pain 04/18/21  Right foot pain M79.671  Tendonitis, Achilles, right M76.61  Gait abnormality R26.9  Metatarsalgia of right foot M77.41 General InformationTherapy Episode of Care  Date of Visit:  04/24/2021   Treatment Number:  3/30   Start of Care Date:  04/18/2021   Onset of Illness/Injury Date:  03/20/2021   Progress Report Due Date:  05/29/2021 Precautions/Limitations   Precautions/Limitations:  No known precautions/limitations and Swallowing precautionsNew Neurological Deficit   Did the patient have a new neurological deficit as evidenced by diminished muscle strength   of less than 3 at any time during the 90 days after spine surgery:  [employer] Utilized?  NoCognition / Learning Assessment   Primary Learner Relationship:  Patient        Barriers to learning:  No barriers        Preferred language:  English        Preferred learning style:  Listening and DemonstrationI reviewed the Patient Care Agreement and Attendance Form with the Patient/Family.  The Patient/Family verbalized understanding.Medication Review:Current Outpatient Medications Medication Sig ? albuterol sulfate  ? diclofenac Apply topically 4 (four) times daily. ? DULoxetine Take 1 capsule (60 mg total) by mouth daily. ? Dupixent Pen Inject 2 mLs (300 mg total) under the skin Once every 2 weeks. ? fluticasone propionate fluticasone propionate 50 mcg/actuation nasal spray,suspension 2 SPRAYS EACH NARE IN THE MORNING ? fluticasone propionate  ? hydrocortisone hydrocortisone 2.5 % topical cream ? ibuprofen Take 1 tablet (800 mg total) by mouth 3 (three) times daily. ? olopatadine Place 1 drop into both eyes daily. ? QUEtiapine Take 1 tablet (50 mg total) by mouth nightly. ? tacrolimus tacrolimus 0.1 % topical ointment APPLY 1 APPLICATION ON THE SKIN TWICE A DAY SubjectiveTThe pain is an 8-9 when walking and sitting is a 6. The tape did not help and came off quickly with the sneaker. zPain with stretching gastroc on stairObjectiveTreatment Provided This VisitCPT Code Interventions Timed Minutes Untimed Minutes Total Minutes Ultrasound (41660) [place] pulsed to achilles insertion region RLE pulsed 3.3 MHz at 0.7 w/cm2gastroc stretch towel in long sitting x3 30 sec 6   Manual Therapy (97140) STM to achilles tendonSTM to longitudinal archTaping to the calcaneal  15   N/A     N/A       Total Treatment Time: 2 AssessmentAdvised to not push through jpain with stretching and hold off on standign stretch if increases pain as patient has good ROM into DFPlanFrequency:  2x per weekPlan for Next VisitContinue PT to reduce pain in calcaneal insertion.  Add band ex next viist

## 2021-04-27 ENCOUNTER — Inpatient Hospital Stay: Admit: 2021-04-27 | Discharge: 2021-04-27 | Payer: BLUE CROSS/BLUE SHIELD | Primary: Internal Medicine

## 2021-04-27 DIAGNOSIS — R269 Unspecified abnormalities of gait and mobility: Secondary | ICD-10-CM

## 2021-04-27 DIAGNOSIS — M79671 Pain in right foot: Secondary | ICD-10-CM

## 2021-04-27 DIAGNOSIS — M7741 Metatarsalgia, right foot: Secondary | ICD-10-CM

## 2021-04-27 DIAGNOSIS — M7661 Achilles tendinitis, right leg: Secondary | ICD-10-CM

## 2021-04-27 NOTE — Other
[facility] [place] [address] Number: [phone] Number: [phone] Therapy Daily NotePatient Name:  Tracy Messenger CluffMedical Record Number:  [id number] Date of Birth:  1967-03-08Therapist:  Deberah Pelton, PTReferring Provider:  Nino Parsley, DPMICD-10 Diagnosis(es):Problem List         ICD-10-CM   PT right foot pain 04/18/21  Right foot pain M79.671  Tendonitis, Achilles, right M76.61  Gait abnormality R26.9  Metatarsalgia of right foot M77.41 General InformationTherapy Episode of Care  Date of Visit:  04/27/2021   Treatment Number:  4/30   Start of Care Date:  04/18/2021   Onset of Illness/Injury Date:  03/20/2021   Progress Report Due Date:  05/29/2021 Precautions/Limitations   Precautions/Limitations:  No known precautions/limitations and Swallowing precautionsNew Neurological Deficit   Did the patient have a new neurological deficit as evidenced by diminished muscle strength   of less than 3 at any time during the 90 days after spine surgery:  [employer] Utilized?  NoCognition / Learning Assessment   Primary Learner Relationship:  Patient        Barriers to learning:  No barriers        Preferred language:  English        Preferred learning style:  Listening and DemonstrationI reviewed the Patient Care Agreement and Attendance Form with the Patient/Family.  The Patient/Family verbalized understanding.Medication Review:Current Outpatient Medications Medication Sig ? albuterol sulfate  ? diclofenac Apply topically 4 (four) times daily. ? DULoxetine Take 1 capsule (60 mg total) by mouth daily. ? Dupixent Pen Inject 2 mLs (300 mg total) under the skin Once every 2 weeks. ? fluticasone propionate fluticasone propionate 50 mcg/actuation nasal spray,suspension 2 SPRAYS EACH NARE IN THE MORNING ? fluticasone propionate  ? hydrocortisone hydrocortisone 2.5 % topical cream ? ibuprofen Take 1 tablet (800 mg total) by mouth 3 (three) times daily. ? olopatadine Place 1 drop into both eyes daily. ? QUEtiapine Take 1 tablet (50 mg total) by mouth nightly. ? tacrolimus tacrolimus 0.1 % topical ointment APPLY 1 APPLICATION ON THE SKIN TWICE A DAY SubjectiveIt just hurts. It hurts to walk and increases more I walk. ObjectiveTreatment Provided This VisitCPT Code Interventions Timed Minutes Untimed Minutes Total Minutes Ultrasound (02725) [place] pulsed to achilles insertion region RLE pulsed 3.3 MHz at 0.7 w/cm2gastroc stretch towel in long sitting x3 30 sec 6   Manual Therapy (97140) STM to achilles tendonSTM to longitudinal archTaping to the calcaneal  10   N/A gastroc stretch strap 3x30 secGreen band Inversion eversion DF and PF 2x10 10   N/A       Total Treatment Time: 26 AssessmentOPain continues with altered gait pattern due to pain today on arrival. NO changes in pain since start of PT PlanFrequency:  2x per weekPlan for Next VisitContinue PT to reduce pain in calcaneal insertion.  Add band ex next viist

## 2021-05-01 ENCOUNTER — Inpatient Hospital Stay: Admit: 2021-05-01 | Discharge: 2021-05-01 | Payer: BLUE CROSS/BLUE SHIELD | Primary: Internal Medicine

## 2021-05-01 DIAGNOSIS — R269 Unspecified abnormalities of gait and mobility: Secondary | ICD-10-CM

## 2021-05-01 DIAGNOSIS — M7741 Metatarsalgia, right foot: Secondary | ICD-10-CM

## 2021-05-01 DIAGNOSIS — M7661 Achilles tendinitis, right leg: Secondary | ICD-10-CM

## 2021-05-01 DIAGNOSIS — M79671 Pain in right foot: Secondary | ICD-10-CM

## 2021-05-01 NOTE — Other
[facility] [place] [address] Number: [phone] Number: [phone] Therapy Daily NotePatient Name:  Tracy Ancheta CluffMedical Record Number:  [id number] Date of Birth:  01-Jun-1967Therapist:  Deberah Pelton, PTReferring Provider:  Nino Parsley, DPMICD-10 Diagnosis(es):Problem List         ICD-10-CM   PT right foot pain 04/18/21  Right foot pain M79.671  Tendonitis, Achilles, right M76.61  Gait abnormality R26.9  Metatarsalgia of right foot M77.41 General InformationTherapy Episode of Care  Date of Visit:  05/01/2021   Treatment Number:  5   Start of Care Date:  04/18/2021   Onset of Illness/Injury Date:  03/20/2021   Progress Report Due Date:  05/29/2021 Precautions/Limitations   Precautions/Limitations:  No known precautions/limitations and Swallowing precautionsNew Neurological Deficit   Did the patient have a new neurological deficit as evidenced by diminished muscle strength   of less than 3 at any time during the 90 days after spine surgery:  [employer] Utilized?  NoCognition / Learning Assessment   Primary Learner Relationship:  Patient        Barriers to learning:  No barriers        Preferred language:  English        Preferred learning style:  Listening and DemonstrationI reviewed the Patient Care Agreement and Attendance Form with the Patient/Family.  The Patient/Family verbalized understanding.Medication Review:Current Outpatient Medications Medication Sig ? albuterol sulfate  ? diclofenac Apply topically 4 (four) times daily. ? DULoxetine Take 1 capsule (60 mg total) by mouth daily. ? Dupixent Pen Inject 2 mLs (300 mg total) under the skin Once every 2 weeks. ? fluticasone propionate fluticasone propionate 50 mcg/actuation nasal spray,suspension 2 SPRAYS EACH NARE IN THE MORNING ? fluticasone propionate  ? hydrocortisone hydrocortisone 2.5 % topical cream ? ibuprofen Take 1 tablet (800 mg total) by mouth 3 (three) times daily. ? olopatadine Place 1 drop into both eyes daily. ? QUEtiapine Take 1 tablet (50 mg total) by mouth nightly. ? tacrolimus tacrolimus 0.1 % topical ointment APPLY 1 APPLICATION ON THE SKIN TWICE A DAY SubjectiveIt just hurts. It hurts to walk and increases more I walk. ObjectiveTreatment Provided This VisitCPT Code Interventions Timed Minutes Untimed Minutes Total Minutes Ultrasound (02725) [place] pulsed to achilles insertion region RLE pulsed 3.3 MHz at 0.7 w/cm2gastroc stretch towel in long sitting x3 30 sec 6   Manual Therapy (97140) STM to achilles tendonSTM to longitudinal arch 10   Therapeutic Exercise (97110) gastroc stretch strap 3x30 secGreen band Inversion eversion DF and PF 2x10Standing gastroc stretch x3 20 seconds. 10   N/A       Total Treatment Time: 26 AssessmentNo changes in pain. Doing well with HEP including band exercises. Able to tolerate standing exercises for stretching.PlanFrequency:  2x per weekPlan for Next VisitContinue PT to reduce pain in calcaneal insertion.

## 2021-05-03 ENCOUNTER — Inpatient Hospital Stay: Admit: 2021-05-03 | Discharge: 2021-05-03 | Payer: BLUE CROSS/BLUE SHIELD | Primary: Internal Medicine

## 2021-05-03 ENCOUNTER — Encounter: Admit: 2021-05-03 | Payer: PRIVATE HEALTH INSURANCE | Attending: Internal Medicine

## 2021-05-03 DIAGNOSIS — R269 Unspecified abnormalities of gait and mobility: Secondary | ICD-10-CM

## 2021-05-03 DIAGNOSIS — M7741 Metatarsalgia, right foot: Secondary | ICD-10-CM

## 2021-05-03 DIAGNOSIS — M7661 Achilles tendinitis, right leg: Secondary | ICD-10-CM

## 2021-05-03 NOTE — Other
[facility]  [address] Number: [phone] Number: [phone] Therapy Daily NotePatient Name:  Tracy Calvario CluffMedical Record Number:  [id number] Date of Birth:  11/28/1967Therapist:  Deberah Pelton, PTReferring Provider:  Nino Parsley, DPMICD-10 Diagnosis(es):Problem List         ICD-10-CM   PT right foot pain 04/18/21  Right foot pain M79.671  Tendonitis, Achilles, right M76.61  Gait abnormality R26.9  Metatarsalgia of right foot M77.41 General InformationTherapy Episode of Care  Date of Visit:  05/03/2021   Treatment Number:  6   Start of Care Date:  04/18/2021   Onset of Illness/Injury Date:  03/20/2021   Progress Report Due Date:  05/29/2021 Precautions/Limitations   Precautions/Limitations:  No known precautions/limitations and Swallowing precautionsNew Neurological Deficit   Did the patient have a new neurological deficit as evidenced by diminished muscle strength   of less than 3 at any time during the 90 days after spine surgery:  [employer] Utilized?  NoCognition / Learning Assessment   Primary Learner Relationship:  Patient        Barriers to learning:  No barriers        Preferred language:  English        Preferred learning style:  Listening and DemonstrationI reviewed the Patient Care Agreement and Attendance Form with the Patient/Family.  The Patient/Family verbalized understanding.Medication Review:Current Outpatient Medications Medication Sig ? albuterol sulfate  ? diclofenac Apply topically 4 (four) times daily. ? DULoxetine Take 1 capsule (60 mg total) by mouth daily. ? Dupixent Pen Inject 2 mLs (300 mg total) under the skin Once every 2 weeks. ? fluticasone propionate fluticasone propionate 50 mcg/actuation nasal spray,suspension 2 SPRAYS EACH NARE IN THE MORNING ? fluticasone propionate  ? hydrocortisone hydrocortisone 2.5 % topical cream ? ibuprofen Take 1 tablet (800 mg total) by mouth 3 (three) times daily. ? olopatadine Place 1 drop into both eyes daily. ? QUEtiapine Take 1 tablet (50 mg total) by mouth nightly. ? tacrolimus tacrolimus 0.1 % topical ointment APPLY 1 APPLICATION ON THE SKIN TWICE A DAY SubjectiveIt just hurts. I see him tomorrow. I hope he okays the surgery because nothing I am doing is making it better. ObjectiveTreatment Provided This VisitCPT Code Interventions Timed Minutes Untimed Minutes Total Minutes Ultrasound (37628) [place] pulsed to achilles insertion region RLE pulsed 3.3 MHz at 0.7 w/cm2gastroc stretch towel in long sitting x3 30 sec 6   Manual Therapy (97140) STM to achilles tendonSTM to longitudinal arch 10   Therapeutic Exercise (97110) gastroc stretch strap 3x30 secGreen band Inversion eversion DF and PF 2x10Standing gastroc stretch x3 20 seconds.Heel ups --unable 10   N/A       Total Treatment Time: 26 AssessmentNo changes in pain. Patient to continue for two more visits and if continues with no change will dc to HEP.PlanFrequency:  2x per weekPlan for Next VisitTwo more visits and re-assess. If no changes will dc to HEP.

## 2021-05-04 ENCOUNTER — Encounter: Admit: 2021-05-04 | Payer: PRIVATE HEALTH INSURANCE

## 2021-05-08 ENCOUNTER — Inpatient Hospital Stay: Admit: 2021-05-08 | Discharge: 2021-05-08 | Payer: BLUE CROSS/BLUE SHIELD | Primary: Internal Medicine

## 2021-05-08 DIAGNOSIS — M79671 Pain in right foot: Secondary | ICD-10-CM

## 2021-05-08 DIAGNOSIS — R269 Unspecified abnormalities of gait and mobility: Secondary | ICD-10-CM

## 2021-05-08 DIAGNOSIS — M7741 Metatarsalgia, right foot: Secondary | ICD-10-CM

## 2021-05-08 DIAGNOSIS — M7661 Achilles tendinitis, right leg: Secondary | ICD-10-CM

## 2021-05-08 NOTE — Other
[facility]Waterford [place] [address]SWFUX Number: [phone] Number: [phone] Therapy Orthopedic Discharge NotePatient Name:  Tracy Hussar CluffMedical Record Number:  [id number] Date of Birth:  [birth year]Therapist:  Deberah Pelton, PTReferring Provider:  Nino Parsley, DPMICD-10 Diagnosis(es):Problem List         ICD-10-CM   PT right foot pain 04/18/21  Right foot pain M79.671  Tendonitis, Achilles, right M76.61  Gait abnormality R26.9  Metatarsalgia of right foot M77.41 General InformationTherapy Episode of Care  Date of Visit:  05/08/2021   Treatment Number:  7   Start of Care Date:  04/18/2021   Onset of Illness/Injury Date:  03/20/2021 Precautions/Limitations   Precautions/Limitations:  No known precautions/limitationsMedication Review:Current Outpatient Medications Medication Sig ? albuterol sulfate  ? diclofenac Apply topically 4 (four) times daily. ? DULoxetine Take 1 capsule (60 mg total) by mouth daily. ? Dupixent Pen Inject 2 mLs (300 mg total) under the skin Once every 2 weeks. ? fluticasone propionate fluticasone propionate 50 mcg/actuation nasal spray,suspension 2 SPRAYS EACH NARE IN THE MORNING ? fluticasone propionate  ? hydrocortisone hydrocortisone 2.5 % topical cream ? ibuprofen Take 1 tablet (800 mg total) by mouth 3 (three) times daily. ? olopatadine Place 1 drop into both eyes daily. ? QUEtiapine Take 1 tablet (50 mg total) by mouth nightly. ? tacrolimus tacrolimus 0.1 % topical ointment APPLY 1 APPLICATION ON THE SKIN TWICE A DAY SubjectivePatient reports saw MD who is doing surgery on 3/29 and having an MRI prior.Pertinent History of Current Problem:  56 yo female with right foot pain that has started to increase again in the past few months and just wants it to feel better and feels it is the heel spur causing the pain. Patient has had two foot surgeries recently release of the plantar fascia last year and then followed by removal of cysts from the same area. Patient does wear shoes at all the time in the home uses orthotics in the right shoe at all times. Patient wears the hoka and brooks sneakers and does stretching of the foot every day. Patient uses ibuprofen for the pain and has found only thing to help her. Patient reports pain worsens by the end of the day. Cannot rest heel on furniture and needs to place with a pillow. Currently visiting [place] but moving back to [place] to [facility].  Patient wishes for heel spur surgery to help with the pain.Other Providers: [email or web address] Medical HistoryNo past medical history on file.Past Surgical HistoryPast Surgical History: Procedure Laterality Date ? THYROIDECTOMY Right  ? TUBAL LIGATION Bilateral  AllergiesCodeinePain Rating:  4-9 at the end of the day / 10   Comments:  Achilles tendon insertion region.Social / Emotional Information: does own cleaning in the home, Prior Level of Function:  Full functioning   Change in Status from prior level of function?  Unable to walk distances, cannot place foot on furniture, greater difficulty falling asleep and rests calf on pillow to fall asleep, difficulty resting foot to drive and keeps foot in the air to drive, unable to walk outside home and husband does grocery shopping. ObjectivePosture: Pes planus bil Increased lumbar lordosisMMT/ROM:Right DF; 12, PF: 46, inversion 26, Eversion: 12Left foot DF: 12, PF: 46, inversion; 26; eversion 12Strength RLE 5/5 with pain with WB plantarflexion LLE 5/5Joint Mobility:WNLPalpation:Tightness right plantarfascia no TTPTenderness to palpation at right calcaneal tuberosity and distal insertion of achilles tendon rightSensation:Intact to light touch bil lEBalance:Difficulty with SLS due to pain RLE but able to maintain for 10 seconds with intrinsic  assistSpecial TestsNAFunctional MobilityGait AssessmentDecreased stance time RLE with decreased heel strike and toe off RLE slow cadenceOrthopedic Tools/Scales/Outcome MeasuresLower Extremity Functional Scale      Any of your usual work/household/school activities:  1      Your usual hobbies/recreation/sporting activities:  0      Getting into or out of the bath:  2      Walking between rooms:  3      Putting on your socks:   4      Squatting:  3      Lifting an object from the floor:  4      Performing light activities around your home:  3      Performing heavy activities around your home:  0      Getting into or out of your car:  2      Walking 2 blocks:  0      Walking a mile:  0      Go up or down 10 stairs:  2      Standing for 1 hour:  0      Sitting for 1 hour:  4      Running on even ground:  0      Running on uneven ground:  0      Making sharp turns while running fast:  0      Hopping:  0      Rolling over in bed:  0   Total Score (out of a possible 80):  28   76-78/80 is normal for population   Minimal detectable change = 9 points   Minimal clinically important difference = 9 points   Potential error +/- 5.3 points   Comments:  Assessment based on foot vs LBPTreatment Provided This VisitCPT Code Interventions Timed Minutes Untimed Minutes Total Minutes Ultrasound (16109) [place] to the achilles tendon for 5 min pulsed 1.0W/cm2 5   Therapeutic Exercise (97110) Re-assessmentReview of standing stretching exSupine stretching exTband strengthening exercises. 10   N/A     N/A       Total Treatment Time: 22 Assessment55 yo female who has had 7 PT visits without any changes in pain. Patient with good ROM of the right ankle and strength full but painful with PF in standing. Patient is ready for dc to HEP as no changes in pain and has surgery scheduled for the foot now for end of March. Patient to continue with exercises as outlined to maintain her strength and flexibility.Goals2 weeks.1. Decreased left heel pain by one point-NOT MET2. Independent with HEP to date-MET4 weeks.1. Reduction in heel pain 0-4 at all times so able to go to the grocery store for shoppingNOT MET2. Able to rest feet on furniture at night due to less tenderness in region NOT MET3. Able to fall asleep easier at night due to less painNOT MET4. Independent with complete HEP-MET5. Able to walk neighborhood 15 min 3x/week due to less foot pain-NOT MET

## 2021-05-11 ENCOUNTER — Ambulatory Visit: Admit: 2021-05-11 | Payer: BLUE CROSS/BLUE SHIELD | Primary: Internal Medicine

## 2021-05-16 DIAGNOSIS — M79671 Pain in right foot: Secondary | ICD-10-CM

## 2021-05-23 ENCOUNTER — Ambulatory Visit: Admit: 2021-05-23 | Payer: BLUE CROSS/BLUE SHIELD | Attending: Internal Medicine

## 2021-05-23 ENCOUNTER — Encounter: Admit: 2021-05-23 | Payer: PRIVATE HEALTH INSURANCE | Attending: Internal Medicine

## 2021-05-24 ENCOUNTER — Encounter: Admit: 2021-05-24 | Payer: PRIVATE HEALTH INSURANCE | Primary: Internal Medicine

## 2021-05-24 ENCOUNTER — Inpatient Hospital Stay: Admit: 2021-05-24 | Discharge: 2021-05-24 | Payer: BLUE CROSS/BLUE SHIELD | Primary: Internal Medicine

## 2021-05-24 DIAGNOSIS — Z131 Encounter for screening for diabetes mellitus: Secondary | ICD-10-CM

## 2021-05-24 DIAGNOSIS — Z1211 Encounter for screening for malignant neoplasm of colon: Secondary | ICD-10-CM

## 2021-05-24 DIAGNOSIS — M2550 Pain in unspecified joint: Secondary | ICD-10-CM

## 2021-05-24 DIAGNOSIS — M7731 Calcaneal spur, right foot: Secondary | ICD-10-CM

## 2021-05-24 DIAGNOSIS — G8929 Other chronic pain: Secondary | ICD-10-CM

## 2021-05-24 DIAGNOSIS — M7661 Achilles tendinitis, right leg: Secondary | ICD-10-CM

## 2021-05-24 DIAGNOSIS — Z13220 Encounter for screening for lipoid disorders: Secondary | ICD-10-CM

## 2021-05-24 DIAGNOSIS — Z1321 Encounter for screening for nutritional disorder: Secondary | ICD-10-CM

## 2021-05-24 DIAGNOSIS — M79671 Pain in right foot: Secondary | ICD-10-CM

## 2021-05-24 DIAGNOSIS — I10 Essential (primary) hypertension: Secondary | ICD-10-CM

## 2021-05-24 DIAGNOSIS — F32A Depression, unspecified: Secondary | ICD-10-CM

## 2021-05-24 DIAGNOSIS — Z0001 Encounter for general adult medical examination with abnormal findings: Secondary | ICD-10-CM

## 2021-05-24 DIAGNOSIS — M545 Low back pain, unspecified: Secondary | ICD-10-CM

## 2021-05-24 DIAGNOSIS — M542 Cervicalgia: Secondary | ICD-10-CM

## 2021-05-24 LAB — CBC WITH AUTO DIFFERENTIAL
BKR POTASSIUM: 39.7 % (ref 35.00–45.00)
BKR WAM ABSOLUTE IMMATURE GRANULOCYTES.: 0.01 x 1000/??L (ref 0.00–0.30)
BKR WAM ABSOLUTE LYMPHOCYTE COUNT.: 3 x 1000/??L (ref 0.60–3.70)
BKR WAM ABSOLUTE NEUTROPHIL COUNT.: 3.96 x 1000/??L (ref 2.00–7.60)
BKR WAM BASOPHIL ABSOLUTE COUNT.: 0.05 x 1000/??L (ref 0.00–1.00)
BKR WAM BASOPHILS: 0.6 % (ref 0.0–1.4)
BKR WAM EOSINOPHIL ABSOLUTE COUNT.: 0.23 x 1000/??L (ref 0.00–1.00)
BKR WAM EOSINOPHILS: 3 % (ref 0.0–5.0)
BKR WAM HEMATOCRIT (2 DEC): 39.7 % (ref 35.00–45.00)
BKR WAM HEMOGLOBIN: 13.3 g/dL (ref 11.7–15.5)
BKR WAM IMMATURE GRANULOCYTES: 0.1 % (ref 0.0–1.0)
BKR WAM LYMPHOCYTES: 39 % (ref 17.0–50.0)
BKR WAM MCH PG: 29.3 pg (ref 27.0–33.0)
BKR WAM MCHC: 33.5 g/dL (ref 5–15)
BKR WAM MCV: 87.4 fL (ref 80.0–100.0)
BKR WAM MONOCYTE ABSOLUTE COUNT.: 0.45 x 1000/??L (ref 0.00–1.00)
BKR WAM MONOCYTES: 5.8 % (ref 4.0–12.0)
BKR WAM MPV: 10.1 fL (ref 8.0–12.0)
BKR WAM NEUTROPHILS: 51.5 % (ref 39.0–72.0)
BKR WAM NUCLEATED RED BLOOD CELLS: 0 % — ABNORMAL HIGH (ref 0.0–1.0)
BKR WAM PLATELETS: 292 x1000/??L (ref 150–420)
BKR WAM RDW-CV: 14.2 % (ref 11.0–15.0)
BKR WAM RED BLOOD CELL COUNT.: 4.54 M/??L (ref 4.00–6.00)
BKR WAM WHITE BLOOD CELL COUNT: 7.7 x1000/??L (ref 4.0–11.0)

## 2021-05-24 LAB — URINALYSIS WITH CULTURE REFLEX      (BH LMW YH)
BKR BILIRUBIN, UA: NEGATIVE % (ref 0.0–1.0)
BKR CREATININE: 1.021 mg/dL (ref 1.005–1.030)
BKR GLUCOSE, UA: NEGATIVE
BKR KETONES, UA: NEGATIVE
BKR LEUKOCYTE ESTERASE, UA: NEGATIVE
BKR NITRITE, UA: NEGATIVE
BKR PH, UA: 7.5 (ref 5.5–7.5)
BKR SPECIFIC GRAVITY, UA: 1.021 (ref 1.005–1.030)
BKR UROBILINOGEN, UA (MG/DL): <0.2 mg/dL (ref 0.60–<=2.0)

## 2021-05-24 LAB — COMPREHENSIVE METABOLIC PANEL
BKR ALANINE AMINOTRANSFERASE (ALT): 70 U/L — ABNORMAL HIGH (ref 13–56)
BKR ALBUMIN: 3.8 g/dL (ref 3.4–5.0)
BKR ALKALINE PHOSPHATASE: 85 U/L (ref 45–117)
BKR ANION GAP (LM): 7 mmol/L (ref 5–15)
BKR ASPARTATE AMINOTRANSFERASE (AST): 34 U/L (ref 15–37)
BKR BILIRUBIN TOTAL: 0.4 mg/dL — AB (ref 0.0–<1.0)
BKR BLOOD UREA NITROGEN: 12 mg/dL (ref 7–18)
BKR CALCIUM: 9.3 mg/dL (ref 8.5–10.1)
BKR CHLORIDE: 106 mmol/L (ref 98–107)
BKR CO2: 27 mmol/L (ref 21–32)
BKR EGFR, CREATININE (CKD-EPI 2021): >60 mL/min/{1.73_m2} (ref >=60)
BKR GLOBULIN: 3.5 g/dL (ref 2.5–5.0)
BKR GLUCOSE: 115 mg/dL — ABNORMAL HIGH (ref 65–110)
BKR PROTEIN TOTAL: 7.3 g/dL (ref 6.4–8.2)
BKR SODIUM: 140 mmol/L (ref 136–145)

## 2021-05-24 LAB — LIPID PANEL
BKR CHOLESTEROL: 151 mg/dL
BKR HDL CHOLESTEROL: 59 mg/dL
BKR LDL CHOLESTEROL SAMPSON CALCULATED: 67 mg/dL
BKR TRIGLYCERIDES: 147 mg/dL

## 2021-05-24 LAB — UA REFLEX CULTURE

## 2021-05-24 LAB — TSH W/REFLEX TO FT4     (BH GH LMW Q YH): BKR THYROID STIMULATING HORMONE (LM): 1.49 u[IU]/mL (ref 0.36–3.74)

## 2021-05-24 LAB — URINE MICROSCOPIC     (BH GH LMW YH)
BKR RBC/HPF INSTRUMENT: 11 /HPF — ABNORMAL HIGH (ref 0–2)
BKR URINE SQUAMOUS EPITHELIAL CELLS, UA (NUMERIC): 2 /HPF (ref 0–5)
BKR WBC/HPF INSTRUMENT: 2 /HPF (ref 0–5)

## 2021-05-24 LAB — HEMOGLOBIN A1C: BKR HEMOGLOBIN A1C: 6.1 % — ABNORMAL HIGH (ref 4.2–6.3)

## 2021-05-28 LAB — QUESTASSURED 25-OH VITAMIN D, (D2,D3), LC/MS/MS (BH GH LMW Q)
BKR GLUCOSE: 31 ng/mL — ABNORMAL HIGH (ref 70–100)
VITAMIN D, 25-OH D2: <4 ng/mL
VITAMIN D, 25-OH D3: 31 ng/mL
VITAMIN D, 25-OH TOTAL: 31 ng/mL (ref 30–100)

## 2021-05-29 ENCOUNTER — Ambulatory Visit: Admit: 2021-05-29 | Payer: BLUE CROSS/BLUE SHIELD | Primary: Internal Medicine

## 2021-05-29 ENCOUNTER — Inpatient Hospital Stay: Admit: 2021-05-29 | Discharge: 2021-05-29 | Payer: BLUE CROSS/BLUE SHIELD | Primary: Internal Medicine

## 2021-05-29 DIAGNOSIS — M79671 Pain in right foot: Secondary | ICD-10-CM

## 2021-05-29 DIAGNOSIS — M7661 Achilles tendinitis, right leg: Secondary | ICD-10-CM

## 2021-05-29 DIAGNOSIS — G8929 Other chronic pain: Secondary | ICD-10-CM

## 2021-05-29 DIAGNOSIS — M7731 Calcaneal spur, right foot: Secondary | ICD-10-CM

## 2021-05-30 ENCOUNTER — Telehealth: Admit: 2021-05-30 | Payer: PRIVATE HEALTH INSURANCE | Attending: Internal Medicine

## 2021-06-02 ENCOUNTER — Inpatient Hospital Stay: Admit: 2021-06-02 | Discharge: 2021-06-02 | Payer: BLUE CROSS/BLUE SHIELD | Primary: Internal Medicine

## 2021-06-02 DIAGNOSIS — Z01818 Encounter for other preprocedural examination: Secondary | ICD-10-CM

## 2021-06-06 ENCOUNTER — Encounter: Admit: 2021-06-06 | Payer: PRIVATE HEALTH INSURANCE | Primary: Internal Medicine

## 2021-06-06 DIAGNOSIS — F319 Bipolar disorder, unspecified: Secondary | ICD-10-CM

## 2021-06-06 DIAGNOSIS — F419 Anxiety disorder, unspecified: Secondary | ICD-10-CM

## 2021-06-06 DIAGNOSIS — F32A Depression: Secondary | ICD-10-CM

## 2021-06-06 NOTE — Other
Left Message for Patient to Return Call Patient has MyChart

## 2021-06-11 NOTE — H&P
Subjective: 56 year old patient presents today for follow-up on pain to the right heel.  Patient states that she had the MRI completed in his care is about the results.  The patient states that the pain to the back of the heel is so severe at this point she cannot wear shoes with a back to it.  Patient would like to proceed with the surgery as discussed for the end of the March.  Patient states that she is awaiting medical clearance from her primary care doctor.  No other issues or concerns at this time.Physical Examination: The patient is appropriately dressed, articulate, awake, alert, and oriented x 3, appears stated age and looks to be in good health. Vascular examination: Palpable DP and PT pulses bilaterally, capillary refill time brisk to all digits, temperature warm to cool, pedal hair is absent.Neurological examination: Epicritic sensation including sharp-dull, light touch, proprioception, vibration (128 MHz tuning fork) and protective threshold (5.07 gram monofilament) are intact and without focal motor or sensory deficit bilateral lower extremities. Musculoskeletal examination: Muscle strength is 5 out of 5 in all planes, tenderness to palpation to the right posterior calcaneal tubercle, bilateral gastrocnemius equinus noted.  Mild pes planus deformity noted bilaterally.Dermatological examination: No gross edema, erythema or increase in skin temperature suggestive of infection.  No wounds noted.  Toenails are cut to hygienic length without any deformities.MRI  -  right ankle CLINICAL INDICATION:  Chronic heel pain, rightHeel spur, rightRight Achilles bursitis COMPARISONS:  Radiograph 12/02/2011 PROTOCOL:  Multiplanar T1 and T2-weighted magnetic resonance images were obtained high field strength (3.0 Tesla). CONTRAST: No contrast administration FINDINGS: The anterior tibiofibular ligament and posterior tibiofibular ligament are intact. The anterior and posterior talofibular ligaments demonstrate elevated signal. [<The peronus brevis and peronus longus tendons are intact.>]  There is small amount of fluid tracking along the posterior tibialis tendon sheath. The flexor hallux longus and flexor digitorum longus tendons appear intact. There is mild elevated signal the Achilles tendon at the insertion. There is plantar calcaneal spurring identified with thickening of the plantar fascia noted. There is small effusion of the sinus tarsi extending into the talocrural joint. [<The talar dome is intact.>] There is mild heterogeneous signal the deltoid ligament. IMPRESSION:  Tendinosis of the Achilles tendon at the level of the insertion of the Achilles tendon. There may be a small amount of interstitial tearing at the distal insertion also noted. Findings suggestive of sequela of chronic planter fasciitis with thickening of the plantar fascia and prominent plantar calcaneal spurring identified. In the soft tissues of the foot on the lateral aspect of the subcutaneous tissues of the calcaneus there is a rounded area of high T2 signal identified measuring 0.6 cm in size. This is nonspecific. This may represent small cystic changes/sequela of trauma but is nonspecific and correlation clinically and follow-up is recommended. Small joint effusion with mild tenosynovitis involving the posterior tibialis tendon. Strain/partial tearing of the anterior and posterior talofibular ligaments as well as the deltoid ligament which appears mild and is of indeterminate age. 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 Other findings as above. Results were logged in the [facility] Radiology Results Notification System per protocol.  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.Based on the findings radiographically and clinically I recommended proceeding with the procedure as it looked.Verbal and written consent was obtained for a right foot Haglund's deformity resection with reattachment of the Achilles tendon, debridement of Achilles tendon and gastrocnemius recession.I advised the patient that after surgery she would be nonweightbearing for the first 4 weeks in a splintDVT prophylaxis will be provided in the form of a  full dose aspirin daily.A preoperative nerve block will be provided by the anesthesia team and narcotic provided as well.Patient would like to proceed with surgery as discussedWill proceed with surgery on June 14, [facility].I advised the patient that the surgery will be performed prone and therefore under general anesthesia.Patient will need medical clearance from her primary care doctor.NPO after midnight prior to surgeryI advised the patient that she will be contacted by the surgery Center regarding arrival time ExlineIn the meantime patient will continue with current treatment consisting of stretching exercises, physical therapy, icing and elevation as well as anti-inflammatories.Patient will follow up approximately 2 weeks after surgery.Imaging reviewed:MRI ankle without contrastPlease CC:Dr. VacchianiPhone: [phone]: [phone]

## 2021-06-13 ENCOUNTER — Ambulatory Visit: Admit: 2021-06-13 | Payer: BLUE CROSS/BLUE SHIELD | Attending: Anesthesiology | Primary: Internal Medicine

## 2021-06-13 DIAGNOSIS — R2689 Other abnormalities of gait and mobility: Secondary | ICD-10-CM

## 2021-06-14 ENCOUNTER — Ambulatory Visit: Admit: 2021-06-14 | Payer: BLUE CROSS/BLUE SHIELD | Attending: Anesthesiology | Primary: Internal Medicine

## 2021-06-14 ENCOUNTER — Inpatient Hospital Stay: Admit: 2021-06-14 | Discharge: 2021-06-14 | Payer: BLUE CROSS/BLUE SHIELD | Primary: Internal Medicine

## 2021-06-14 ENCOUNTER — Encounter: Admit: 2021-06-14 | Payer: PRIVATE HEALTH INSURANCE | Primary: Internal Medicine

## 2021-06-14 ENCOUNTER — Inpatient Hospital Stay: Admit: 2021-06-14 | Discharge: 2021-06-14 | Payer: BLUE CROSS/BLUE SHIELD | Attending: Emergency Medicine

## 2021-06-14 DIAGNOSIS — R2689 Other abnormalities of gait and mobility: Secondary | ICD-10-CM

## 2021-06-14 DIAGNOSIS — M7731 Calcaneal spur, right foot: Secondary | ICD-10-CM

## 2021-06-14 DIAGNOSIS — F32A Depression: Secondary | ICD-10-CM

## 2021-06-14 DIAGNOSIS — F419 Anxiety disorder, unspecified: Secondary | ICD-10-CM

## 2021-06-14 DIAGNOSIS — F319 Bipolar disorder, unspecified: Secondary | ICD-10-CM

## 2021-06-14 DIAGNOSIS — M7661 Achilles tendinitis, right leg: Secondary | ICD-10-CM

## 2021-06-14 DIAGNOSIS — M9261 Juvenile osteochondrosis of tarsus, right ankle: Secondary | ICD-10-CM

## 2021-06-14 MED ORDER — PROPOFOL 10 MG/ML INTRAVENOUS EMULSION
10 mg/mL | INTRAVENOUS | Status: DC | PRN
Start: 2021-06-14 — End: 2021-06-14
  Administered 2021-06-14: 12:00:00 10 mL/h via INTRAVENOUS

## 2021-06-14 MED ORDER — ROPIVACAINE (PF) 5 MG/ML (0.5 %) INJECTION SOLUTION
5 mg/mL (0. %) | PERINEURAL | Status: DC | PRN
Start: 2021-06-14 — End: 2021-06-14
  Administered 2021-06-14: 11:00:00 5 mg/mL (0. %) via PERINEURAL

## 2021-06-14 MED ORDER — ASPIRIN IMMEDIATE RELEASE 325 MG TABLET
325 mg | ORAL_TABLET | Freq: Every day | ORAL | 3 refills | Status: AC
Start: 2021-06-14 — End: ?

## 2021-06-14 MED ORDER — SODIUM CHLORIDE 0.9 % BOLUS (NEW BAG)
0.9 % | Freq: Once | INTRAVENOUS | Status: CP
Start: 2021-06-14 — End: ?
  Administered 2021-06-15: 01:00:00 0.9 mL/h via INTRAVENOUS

## 2021-06-14 MED ORDER — CEFAZOLIN 1 GRAM SOLUTION FOR INJECTION
1 gram | INTRAVENOUS | Status: DC | PRN
Start: 2021-06-14 — End: 2021-06-14
  Administered 2021-06-14: 12:00:00 1 gram via INTRAVENOUS

## 2021-06-14 MED ORDER — LACTATED RINGERS INTRAVENOUS SOLUTION
INTRAVENOUS | Status: DC
Start: 2021-06-14 — End: 2021-06-14

## 2021-06-14 MED ORDER — MIDAZOLAM (PF) 1 MG/ML INJECTION SOLUTION
1 mg/mL | Status: CP
Start: 2021-06-14 — End: ?

## 2021-06-14 MED ORDER — PROPOFOL 10 MG/ML INTRAVENOUS EMULSION
10 mg/mL | INTRAVENOUS | Status: DC | PRN
Start: 2021-06-14 — End: 2021-06-14
  Administered 2021-06-14: 12:00:00 10 mg/mL via INTRAVENOUS

## 2021-06-14 MED ORDER — IBUPROFEN 600 MG TABLET
600 mg | ORAL_TABLET | Freq: Four times a day (QID) | ORAL | 3 refills | Status: AC | PRN
Start: 2021-06-14 — End: ?

## 2021-06-14 MED ORDER — ONDANSETRON HCL (PF) 4 MG/2 ML INJECTION SOLUTION
4 mg/2 mL | INTRAVENOUS | Status: DC | PRN
Start: 2021-06-14 — End: 2021-06-14

## 2021-06-14 MED ORDER — KETOROLAC 15 MG/ML INJECTION SOLUTION
15 mg/mL | INTRAVENOUS | Status: DC | PRN
Start: 2021-06-14 — End: 2021-06-14

## 2021-06-14 MED ORDER — NALOXONE 0.4 MG/ML INJECTION SOLUTION
0.4 mg/mL | INTRAVENOUS | Status: DC | PRN
Start: 2021-06-14 — End: 2021-06-14

## 2021-06-14 MED ORDER — BUPIVACAINE (PF) 0.5 % (5 MG/ML) INJECTION SOLUTION
0.5 % (5 mg/mL) | Status: CP
Start: 2021-06-14 — End: ?

## 2021-06-14 MED ORDER — ONDANSETRON 4 MG DISINTEGRATING TABLET
4 mg | ORAL_TABLET | Freq: Three times a day (TID) | 1 refills | Status: AC | PRN
Start: 2021-06-14 — End: ?

## 2021-06-14 MED ORDER — OXYCODONE-ACETAMINOPHEN 5 MG-325 MG TABLET
5-325 mg | ORAL_TABLET | Freq: Four times a day (QID) | ORAL | 1 refills | Status: AC | PRN
Start: 2021-06-14 — End: 2021-06-27

## 2021-06-14 MED ORDER — DEXAMETHASONE SODIUM PHOSPHATE 4 MG/ML INJECTION SOLUTION
4 mg/mL | INTRAVENOUS | Status: DC | PRN
Start: 2021-06-14 — End: 2021-06-14
  Administered 2021-06-14: 11:00:00 4 mg/mL via INTRAVENOUS

## 2021-06-14 MED ORDER — FENTANYL (PF) 50 MCG/ML INJECTION SOLUTION
50 mcg/mL | INTRAVENOUS | Status: DC | PRN
Start: 2021-06-14 — End: 2021-06-14
  Administered 2021-06-14: 11:00:00 50 mcg/mL via INTRAVENOUS

## 2021-06-14 MED ORDER — LACTATED RINGERS INTRAVENOUS SOLUTION
INTRAVENOUS | Status: DC
Start: 2021-06-14 — End: 2021-06-14
  Administered 2021-06-14: 11:00:00 1000.000 mL/h via INTRAVENOUS

## 2021-06-14 MED ORDER — SODIUM CHLORIDE 0.9 % (FLUSH) INJECTION SYRINGE
0.9 % | INTRAVENOUS | Status: DC | PRN
Start: 2021-06-14 — End: 2021-06-14

## 2021-06-14 MED ORDER — MIDAZOLAM 1 MG/ML INJECTION SOLUTION
1 mg/mL | INTRAVENOUS | Status: DC | PRN
Start: 2021-06-14 — End: 2021-06-14
  Administered 2021-06-14: 11:00:00 1 mg/mL via INTRAVENOUS

## 2021-06-14 MED ORDER — SODIUM CHLORIDE 0.9 % (FLUSH) INJECTION SYRINGE
0.9 % | Freq: Three times a day (TID) | INTRAVENOUS | Status: DC
Start: 2021-06-14 — End: 2021-06-14

## 2021-06-14 MED ORDER — CEFAZOLIN IV PUSH 1 GRAM VIAL & 0.9% SODIUM CHLORIDE (ADULT)
Freq: Once | INTRAVENOUS | Status: DC
Start: 2021-06-14 — End: 2021-06-14

## 2021-06-14 MED ORDER — FENTANYL (PF) 50 MCG/ML INJECTION SOLUTION
50 mcg/mL | INTRAVENOUS | Status: DC | PRN
Start: 2021-06-14 — End: 2021-06-14

## 2021-06-14 MED ORDER — LIDOCAINE (PF) 10 MG/ML (1 %) INJECTION SYR/SOL (WRAPPED E-RX)
10 mg/mL (1 %) | Status: CP
Start: 2021-06-14 — End: ?

## 2021-06-14 MED ORDER — ONDANSETRON HCL (PF) 4 MG/2 ML INJECTION SOLUTION
42 mg/2 mL | Freq: Once | INTRAVENOUS | Status: CP
Start: 2021-06-14 — End: ?
  Administered 2021-06-15: 01:00:00 4 mL via INTRAVENOUS

## 2021-06-14 MED ORDER — LACTATED RINGERS INTRAVENOUS SOLUTION
INTRAVENOUS | Status: DC | PRN
Start: 2021-06-14 — End: 2021-06-14
  Administered 2021-06-14: 11:00:00 via INTRAVENOUS

## 2021-06-14 MED ORDER — SODIUM CHLORIDE 0.9 % IRRIGATION SOLUTION
0.9 % irrigation | Status: CP | PRN
Start: 2021-06-14 — End: ?
  Administered 2021-06-14: 12:00:00 0.9 % irrigation

## 2021-06-14 MED ORDER — FENTANYL (PF) 50 MCG/ML INJECTION SOLUTION
50 mcg/mL | Status: CP
Start: 2021-06-14 — End: ?

## 2021-06-14 NOTE — Other
Post Anesthesia Transfer of Care NotePatient: Tracy A CluffProcedure(s) Performed: Procedure(s) (LRB):RIGHT HAGLANDS RESECTION, ACHILLES TENDON REPAIR, GASTROCNEMIUS RECESSION (Right)EXCISION/CURETTAGE, BONE CYST/BENIGN TUMOR, TALUS/CALCANSUS; (Right) Patient location: Phase II Last Vitals: Vitals Value Taken Time BP 129/90 06/14/21 0923 Temp 36.4 ?C 06/14/21 0905 Pulse 73 06/14/21 0929 Resp 16 06/14/21 0929 SpO2 99 % 06/14/21 0929 Vitals shown include unvalidated device data.Level of consciousness: awakeTransport Vital Signs:  Stable since the last set of recorded intra-operative vital signsIntra-operative Complications: noneIntra-operative Intake & Output and Antibiotics as per Anesthesia record and discussed with the RN.

## 2021-06-14 NOTE — Anesthesia Pre-Procedure Evaluation
This is a 56 y.o. female scheduled for RIGHT HAGLANDS RESECTION, ACHILLES TENDON REPAIR, GASTROCNEMIUS RECESSION (Right)EXCISION/CURETTAGE, BONE CYST/BENIGN TUMOR, TALUS/CALCANSUS; (Right).Review of Systems/ Medical HistoryPatient summary, Labs, pre-procedure vitals, height, weight and NPO status reviewed.No previous anesthesia concernsAnesthesia Evaluation: Estimated body mass index is 31.18 kg/m? as calculated from the following:  Height as of 05/23/21: 5' 1 (1.549 m).  Weight as of this encounter: 74.8 kg. Cardiovascular: Negative   Respiratory:  Negative.Gastrointestinal/Genitourinary: -Nutritional Disorders: Pt is obese per BMI definition-Physical ExamCardiovascular:    normal exam  Rhythm: regularPulmonary:  normal exam  Airway:  Mallampati: ITM distance: >3 FBNeck ROM: fullMouth Opening: >3cmDental:  unremarkable  Anesthesia PlanASA 2 The primary anesthesia plan is  MAC. REGIONAL Perioperative Code Status confirmed: It is my understanding that the patient is currently designated as 'Full Code' and will remain so throughout the perioperative period.Anesthesia informed consent obtained. Anesthesia written consent obtainedConsent obtained from: patientPlan discussed with Attending.Anesthesiologist's Pre Op NoteI personally evaluated and examined the patient prior to the intra-operative phase of care on the day of the procedure.Marland Kitchen

## 2021-06-14 NOTE — Anesthesia Procedure Notes
Room and Bed: [facility] PEQ PERIOP , Pool  Current Location: [facility] OR Lower Extremity Block:  Adductor canal and Sciatic- popliteal approachPain Diagnosis/Location: RIGHT HAGLANDS RESECTION AND CHILLES REPAIRThis block procedure is being performed for post-operative pain management at the request of:      Requesting Physician: Lyman Bishop, MATTPre-procedure Checklistpatient identified; site marked; surgical consent reviewd; pre-op evaluation performed; Universal Protocol/timeout performed; IV checked; monitors and equipment checked; informed consent obtained and options, plan, risks & benefits discussed with patientUniversal ProtocolTiming: the time out is initiated after the patient is positioned and prior to the beginning of the procedure: YesName of patient and medical record number or DOB (if MRN is unavailable) stated and checked with ID band or        previously confirmed MEDICAL RECORD NUMBERYesProceduralist states or confirms the procedure to be performed: YesThe procedural consent is used to verify the procedure to be performed: YesSite of procedure(s) (with laterally or level) is topically marked per policy and visible after draping: YesRadiographic imaging is present or a laterality side/site band is present on patient and is accessible: N/AMedications addressed: YesBlood Addressed: YesImaging addressed: N/AImplants addressed: N/ASpecial equipment or other needs addressed: N/AProceduralist discusses particular challenges or special considerations: N/ABaseline Neurological Deficits:NonePre-op Anti-coagulation ZOX:[id number] pre-procedure mental status:  Sedate but cooperativePrep: chloraprepNeedle A:  Adductor canal and Sciatic- popliteal approachLaterality: rightInjection technique: single-shotNeedle:      Gauge: 22 G      Length: 4 inTechnique: Ultrasound guidedEvent(s): blood not aspirated, injection not painful, no injection resistance, no cerebrospinal fluid, no paresthesia and no other eventPerformed By: , personally Patient's position for procedure:supineOutcome:  successful blockAttempts:  block completePatient tolerated block procedure well?:  yes

## 2021-06-14 NOTE — Anesthesia Post-Procedure Evaluation
Anesthesia Post-op NotePatient: Tracy A CluffProcedure(s):  Procedure(s) (LRB):RIGHT HAGLANDS RESECTION, ACHILLES TENDON REPAIR, GASTROCNEMIUS RECESSION (Right)EXCISION/CURETTAGE, BONE CYST/BENIGN TUMOR, TALUS/CALCANSUS; (Right) Patient location: PACULast Vitals:  I have noted the vital signs as listed in the nursing notes.Mental status recovered: patient participates in evaluation: YesVital signs reviewed: YesRespiratory function stable:YesAirway is patent: YesCardiovascular function and hydration status stable: YesPain control satisfactory: YesNausea and vomiting control satisfactory:YesNo notable events documented.

## 2021-06-14 NOTE — Progress Notes
Patient seen bedside in preop [id number] since midnightRLE signed and marked

## 2021-06-14 NOTE — Discharge Instructions
Strict nonweightbearing to right lower extremity Keep dressing dry, clean and intact.  Ice behind the knee and elevate.  Take pain medications as prescribed.  Take aspirin daily for DVT prophylaxis.

## 2021-06-14 NOTE — Other
Operative Diagnosis:Pre-op:   * No pre-op diagnosis entered * Patient Coded Diagnosis   Pre-op diagnosis: Calcaneal spur of right foot, Limp  Post-op diagnosis: Calcaneal spur of right foot, Limp  Patient Diagnosis   None    Post-op diagnosis:   * Calcaneal spur of right foot [M77.31]   * Limp [R26.89]Operative Procedure(s) :Procedure(s) (LRB):RIGHT HAGLANDS RESECTION, ACHILLES TENDON REPAIR, GASTROCNEMIUS RECESSION (Right)EXCISION/CURETTAGE, BONE CYST/BENIGN TUMOR, TALUS/CALCANSUS; (Right)Post-op Procedure & Diagnosis ConfirmationPost-op Diagnosis: Post-op Diagnosis confirmed (no changes)Post-op Procedure: Post-op Procedure confirmed (no changes)

## 2021-06-14 NOTE — Other
Patient name: Tracy Huge. 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  Surgeon: Gearlean Alf. Kandice Moos DPMAssistant: Alain Honey DPM PGY 3Anesthesia:  Mac with proximal nerve block EBL:  Less than 10 mL Fluids: Per AnesthesiaProcedure:Patient was both verbally and visually identified in preoperative holding area where preop consent was signed, obtained and placed in chart.  All risks, benefits and complications were explained to the patient.  Patient received IV antibiotics preoperatively.  Patient also received a proximal nerve block by the anesthesia team.  Patient was then transported to the OR by members of Surgical and anesthesia teams where patient was placed in prone position, after adequate anesthesia was administered a well-padded right thigh tourniquet was applied.  The right lower extremity was then prepped and draped in sterile fashion, utilizing Esmarch bandage right lower extremity was exsanguinated and tourniquet was inflated to 300 mmHg.  After final time-out was performed attention was directed towards the posterior aspect of the right lower leg, utilizing marking pen a linear longitudinal line was mapped out just medial to the midline and distal to the gastrocnemius muscle measuring approximately 3 cm, utilizing 15 blade incision was made down superficial fascia following the line drawn, for the soft tissue dissection was then achieved utilizing 1-2 pickup and 15 blade.  Blunt dissection was carried down to the paratenon where an incision was made through the paratenon and a standard Strayer type gastrocnemius recession was performed yielding about 2-1/2 cm of gapping.  Great care was taken to avoid the surrounding neurovascular structures as well as the underlying muscle belly.  This was then thoroughly irrigated, the paratenon was then reapproximated utilizing 3-0 Vicryl, deep closure was then achieved utilizing 3-0 Vicryl and cutaneous closure was achieved utilizing 3-0 nylon.  Next, attention was directed towards the posterior aspect of the right heel, utilizing marking pen a linear longitudinal line was mapped out over the posterior aspect of the heel measuring approximately 7 cm, utilizing 15 blade incision was made on superficial fascia following the line drawn, for the soft tissue dissection was then achieved utilizing 1-2 pickup and 15 blade.  Incision was then made through paratenon which was dissected medially laterally, incision was then made through the Achilles tendon and down to bone, the Achilles tendon was then reflected both medially and laterally keeping the outer most attachments intact, the spur was then resected and the posterior aspect of the heel was remodeled with the aid of the C-arm.  The surgical site was then thoroughly irrigated, the Achilles tendon was then reattached utilizing the Arthrex speed bridge per manufacture guidelines once the Haglund's deformity has been completely resected.  The Achilles tendon was then reapproximated utilizing 3-0 Vicryl, the paratenon was then repaired utilizing 5-0 Monocryl, subcutaneous closure was then achieved utilizing 4-0 Vicryl and cutaneous closure achieved utilizing 3-0 nylon.  Tourniquet was then deflated at 62 minutes, the incision was then dressed with antimicrobial dressing followed by fluffs, Kerlix and Ace bandage.  A well-padded posterior splint was then applied and reinforced with Ace bandage, ankle was held at 90? until the posterior splint as set.  Patient was then awakened, patient seemed to have tolerated procedure well, patient was then transported to PACU by anesthesia staff.  Patient will be discharged home with the  following instructions: Ice, elevation, take pain medications as instructed, take aspirin daily for DVT prophylaxis.  Keep dressing dry, clean and intact, patient will follow up in my office in approximately 2 weeks.  Strict nonweightbearing to the right lower extremity.

## 2021-06-15 ENCOUNTER — Encounter: Admit: 2021-06-15 | Payer: PRIVATE HEALTH INSURANCE | Primary: Internal Medicine

## 2021-06-15 NOTE — Discharge Instructions
Take medication as prescribed. Follow up with your primary doctor. Return immediately for any new/concerning symptoms.

## 2021-06-15 NOTE — ED Provider Notes
Chief Complaint Patient presents with ? Emesis   Pt had surgery on her r footNausea all day since she got homeVomiting the last 3 hours and feeling like I was going to pass out BRIEF [id number] and vomiting.  Patient had surgery on her right foot today.  Has had nausea since she got home.  Vomiting for the last 3 hours and feeling dehydrated/lightheaded.  Feels like she is dehydrated from the vomiting.  Emesis nonbloody/nonbilious.  No recent sick contacts or travel.  No history of prior postoperative nausea vomiting.ROS10 systems reviewed and negative unless stated otherwise in HPIPHYSICAL EXAMBP (!) 133/91  - Pulse 80  - Temp 98.4 ?F (36.9 ?C) (Oral)  - Resp 16  - SpO2 98%  GENERAL APPEARANCE: Awake and alert. Cooperative. No acute distress.HENT: Normocephalic. Atraumatic. Pinna intactNECK: Supple. EYES: EOM's grossly intact.HEART/CHEST:  Regular rate, No chest wall tenderness.LUNGS: Respirations unlabored. CTABABDOMEN: No tenderness. Soft. EXTREMITIES: No clubbing, cyanosis or edemaSKIN: Warm and dry. No acute rashes. NEUROLOGICAL: Alert and oriented. Moves all extremities and follows basic commands.No results found for this or any previous visit (from the past 24 hour(s)).No results found.Medical Decision MakingNausea and vomiting, unspecified vomiting type: acute illness or injuryRiskPrescription drug management.Risk Details: Patient with nausea vomiting.  Emesis nonbloody nonbilious.  Soft abdomen.  No obvious focal findings that would suggest the need for imaging or lab work.  Patient given IV fluid and antiemetic, tolerate p.o., feels much better.  Will discharge home with antiemetic.  Advised to follow up with primary care.  Return for new or worsening symptoms.Vitals:  06/14/21 2053 06/14/21 2159 BP: (!) 160/96 (!) 133/91 Pulse: (!) 107 80 Resp: 20 16 Temp: 98.4 ?F (36.9 ?C)  TempSrc: Oral  SpO2: 100% 98% Attestation/Critical CareClinical Impressions as of 06/14/21 2234 Nausea and vomiting, unspecified vomiting type DispoDischarge Wardell Honour, MD03/29/23 2234

## 2021-06-22 ENCOUNTER — Encounter: Admit: 2021-06-22 | Payer: BLUE CROSS/BLUE SHIELD | Attending: Internal Medicine

## 2021-06-27 ENCOUNTER — Encounter: Admit: 2021-06-27 | Payer: BLUE CROSS/BLUE SHIELD | Attending: Internal Medicine

## 2021-06-27 ENCOUNTER — Encounter: Admit: 2021-06-27 | Payer: PRIVATE HEALTH INSURANCE | Attending: Internal Medicine

## 2021-06-27 MED ORDER — LORAZEPAM 0.5 MG TABLET
0.5 | ORAL_TABLET | Freq: Two times a day (BID) | ORAL | 1 refills | 15.00000 days | Status: AC | PRN
Start: 2021-06-27 — End: ?

## 2021-07-07 ENCOUNTER — Encounter: Admit: 2021-07-07 | Payer: PRIVATE HEALTH INSURANCE | Primary: Internal Medicine

## 2021-07-07 MED ORDER — OXYCODONE-ACETAMINOPHEN 5 MG-325 MG TABLET
5-325 mg | ORAL_TABLET | Freq: Four times a day (QID) | ORAL | 1 refills | Status: AC | PRN
Start: 2021-07-07 — End: 2021-07-13

## 2021-07-12 ENCOUNTER — Encounter: Admit: 2021-07-12 | Payer: PRIVATE HEALTH INSURANCE | Primary: Internal Medicine

## 2021-07-12 MED ORDER — OXYCODONE-ACETAMINOPHEN 5 MG-325 MG TABLET
5-325 mg | ORAL_TABLET | Freq: Four times a day (QID) | ORAL | 1 refills | Status: AC | PRN
Start: 2021-07-12 — End: ?

## 2021-09-12 ENCOUNTER — Encounter: Admit: 2021-09-12 | Payer: BLUE CROSS/BLUE SHIELD | Attending: Internal Medicine

## 2023-03-27 ENCOUNTER — Telehealth: Admit: 2023-03-27 | Payer: PRIVATE HEALTH INSURANCE | Attending: Internal Medicine

## 2023-10-30 ENCOUNTER — Emergency Department: Admit: 2023-10-30 | Payer: BLUE CROSS/BLUE SHIELD

## 2023-10-30 ENCOUNTER — Inpatient Hospital Stay: Admit: 2023-10-30 | Discharge: 2023-10-30 | Payer: BLUE CROSS/BLUE SHIELD

## 2023-10-30 ENCOUNTER — Encounter: Admit: 2023-10-30 | Payer: PRIVATE HEALTH INSURANCE

## 2023-10-30 DIAGNOSIS — F419 Anxiety disorder, unspecified: Secondary | ICD-10-CM

## 2023-10-30 DIAGNOSIS — F32A Depression: Principal | ICD-10-CM

## 2023-10-30 DIAGNOSIS — F319 Bipolar disorder, unspecified: Secondary | ICD-10-CM

## 2023-10-30 MED ORDER — GABAPENTIN 300 MG CAPSULE
300 | ORAL | 2.00 refills | 30.00000 days | Status: AC
Start: 2023-10-30 — End: ?

## 2023-10-30 NOTE — Discharge Instructions
 Continue to take the gabapentin  as prescribed.  Follow up with the orthopedic when you return home.  You may add Tylenol  as needed for comfort as well as the ibuprofen 

## 2023-10-30 NOTE — ED Provider Notes
 Chief Complaint:Chief Complaint Patient presents with  Knee Pain   Pt states right knee pain, states it has hurt for months now and it is getting worse, unsure of any injury.   Tracy Hoffman is a 58 y.o. female who presents to the ED with right knee pain.  Can not exactly say when the pain started but at least 6 months ago.  Denies any recent trauma.  Denies any swelling or fever.  States she has chronic joint pain, and sees a pain specialist in [place] .  Also on gabapentin  and ibuprofen .  Denies any new trauma.  Patient has not seen an orthopedic for discuss this with her primary care      External record(s) reviewed which indicated:None Vitals:  10/30/23 1418 10/30/23 1419 BP: 122/77  Pulse: (!) 92  Resp: 18  Temp: 98.4 ?F (36.9 ?C)  SpO2: 96%  Weight:  76.7 kg Height:  5' 2 (1.575 m) Physical ExamVitals and nursing note reviewed. Constitutional:     Appearance: Normal appearance. HENT:    Head: Normocephalic and atraumatic.    Nose: Nose normal. Eyes:    Extraocular Movements: Extraocular movements intact. Cardiovascular:    Rate and Rhythm: Normal rate and regular rhythm.    Pulses: Normal pulses.    Heart sounds: Normal heart sounds. Pulmonary:    Effort: Pulmonary effort is normal.    Breath sounds: Normal breath sounds. Musculoskeletal:       General: Normal range of motion.    Cervical back: Normal range of motion and neck supple.    Right knee: No swelling, deformity, effusion, erythema, ecchymosis, lacerations, bony tenderness or crepitus. Normal range of motion. Tenderness present over the medial joint line and lateral joint line. No LCL laxity, MCL laxity, ACL laxity or PCL laxity. Normal alignment, normal meniscus and normal patellar mobility. Normal pulse. Skin:   General: Skin is warm. Neurological:    General: No focal deficit present.    Mental Status: She is alert. Psychiatric: Mood and Affect: Mood normal.   Medical Decision Making:Patient has no sign of infection, no sign of effusion.  Has full range of motion but states it is very tender to ambulate.  Discussed adding Tylenol  to her gabapentin  and ibuprofen  regimen.  She is also not taking her gabapentin  appropriately, discussed taking it as directed and she verbalized understanding.  Mentioned that she lives in [place]  so she will follow up with the orthopedic when she returns to [place] .Clinical Impressions as of 10/30/23 1559 Chronic pain of right knee  Attestation/Critical Care  Discussion of Management with other Physicians or Appropriate Source:None My Independent Interpretation of Studies: XRay(s) no acute fracture seen, no effusionAdmission/Observation was considered:No Supervision:Supervising Physician was available for consultation Procedures  Jennifer Sawyer, APRN08/13/25 1559
